# Patient Record
Sex: Female | Born: 1980 | Race: Black or African American | Hispanic: No | Marital: Single | Smoking: Never smoker
Health system: Southern US, Community
[De-identification: ages and names within clinical notes are randomized; demographics above are authoritative.]

## PROBLEM LIST (undated history)

## (undated) ENCOUNTER — Inpatient Hospital Stay (HOSPITAL_COMMUNITY): Payer: Self-pay

## (undated) DIAGNOSIS — K589 Irritable bowel syndrome without diarrhea: Secondary | ICD-10-CM

## (undated) DIAGNOSIS — S8291XA Unspecified fracture of right lower leg, initial encounter for closed fracture: Secondary | ICD-10-CM

## (undated) HISTORY — PX: NO PAST SURGERIES: SHX2092

---

## 1998-05-22 ENCOUNTER — Other Ambulatory Visit: Admission: RE | Admit: 1998-05-22 | Discharge: 1998-05-22 | Payer: Self-pay | Admitting: Obstetrics and Gynecology

## 1998-10-02 ENCOUNTER — Other Ambulatory Visit: Admission: RE | Admit: 1998-10-02 | Discharge: 1998-10-02 | Payer: Self-pay | Admitting: Obstetrics and Gynecology

## 1999-09-15 ENCOUNTER — Inpatient Hospital Stay (HOSPITAL_COMMUNITY): Admission: AD | Admit: 1999-09-15 | Discharge: 1999-09-15 | Payer: Self-pay | Admitting: Obstetrics and Gynecology

## 2000-03-19 ENCOUNTER — Encounter: Payer: Self-pay | Admitting: Emergency Medicine

## 2000-03-19 ENCOUNTER — Emergency Department (HOSPITAL_COMMUNITY): Admission: EM | Admit: 2000-03-19 | Discharge: 2000-03-19 | Payer: Self-pay | Admitting: Emergency Medicine

## 2003-04-14 ENCOUNTER — Other Ambulatory Visit: Admission: RE | Admit: 2003-04-14 | Discharge: 2003-04-14 | Payer: Self-pay | Admitting: Obstetrics and Gynecology

## 2005-03-08 ENCOUNTER — Other Ambulatory Visit: Admission: RE | Admit: 2005-03-08 | Discharge: 2005-03-08 | Payer: Self-pay | Admitting: Obstetrics and Gynecology

## 2006-06-24 HISTORY — PX: GYNECOLOGIC CRYOSURGERY: SHX857

## 2008-09-06 ENCOUNTER — Ambulatory Visit (HOSPITAL_BASED_OUTPATIENT_CLINIC_OR_DEPARTMENT_OTHER): Admission: RE | Admit: 2008-09-06 | Discharge: 2008-09-06 | Payer: Self-pay | Admitting: Family Medicine

## 2008-09-06 ENCOUNTER — Ambulatory Visit: Payer: Self-pay | Admitting: Diagnostic Radiology

## 2012-09-08 ENCOUNTER — Encounter (HOSPITAL_COMMUNITY): Payer: Self-pay | Admitting: Pharmacist

## 2012-09-11 ENCOUNTER — Encounter (HOSPITAL_COMMUNITY): Payer: Self-pay | Admitting: Anesthesiology

## 2012-09-11 ENCOUNTER — Encounter (HOSPITAL_COMMUNITY): Payer: Self-pay | Admitting: *Deleted

## 2012-09-11 ENCOUNTER — Inpatient Hospital Stay (HOSPITAL_COMMUNITY)
Admission: AD | Admit: 2012-09-11 | Discharge: 2012-09-13 | DRG: 371 | Disposition: A | Discharge status: Home / Self Care | Payer: BC Managed Care – PPO | Source: Ambulatory Visit | Attending: Obstetrics and Gynecology | Admitting: Obstetrics and Gynecology

## 2012-09-11 ENCOUNTER — Inpatient Hospital Stay (HOSPITAL_COMMUNITY): Payer: BC Managed Care – PPO | Admitting: Anesthesiology

## 2012-09-11 ENCOUNTER — Encounter (HOSPITAL_COMMUNITY)
Admission: AD | Disposition: A | Discharge status: Home / Self Care | Payer: Self-pay | Source: Ambulatory Visit | Attending: Obstetrics and Gynecology

## 2012-09-11 DIAGNOSIS — O321XX0 Maternal care for breech presentation, not applicable or unspecified: Principal | ICD-10-CM | POA: Diagnosis present

## 2012-09-11 LAB — CBC
HCT: 36.4 % (ref 36.0–46.0)
Hemoglobin: 12.4 g/dL (ref 12.0–15.0)
MCH: 29.8 pg (ref 26.0–34.0)
MCHC: 34.1 g/dL (ref 30.0–36.0)

## 2012-09-11 SURGERY — Surgical Case
Anesthesia: Spinal | Site: Abdomen | Wound class: Clean Contaminated

## 2012-09-11 MED ORDER — DIPHENHYDRAMINE HCL 25 MG PO CAPS: 25.0000 mg | ORAL_CAPSULE | Freq: Four times a day (QID) | ORAL | Status: DC | PRN

## 2012-09-11 MED ORDER — OXYCODONE-ACETAMINOPHEN 5-325 MG PO TABS
1.0000 | ORAL_TABLET | ORAL | Status: DC | PRN
  Administered 2012-09-12 (×2): 1 via ORAL
  Administered 2012-09-12 – 2012-09-13 (×4): 2 via ORAL
  Filled 2012-09-11 (×4): qty 2
  Filled 2012-09-11 (×2): qty 1

## 2012-09-11 MED ORDER — ONDANSETRON HCL 4 MG/2ML IJ SOLN
INTRAMUSCULAR | Status: AC
  Filled 2012-09-11: qty 2

## 2012-09-11 MED ORDER — GLYCOPYRROLATE 0.2 MG/ML IJ SOLN
INTRAMUSCULAR | Status: DC | PRN
  Administered 2012-09-11: 0.2 mg via INTRAVENOUS

## 2012-09-11 MED ORDER — TETANUS-DIPHTH-ACELL PERTUSSIS 5-2.5-18.5 LF-MCG/0.5 IM SUSP: 0.5000 mL | Freq: Once | INTRAMUSCULAR | Status: DC

## 2012-09-11 MED ORDER — OXYTOCIN 10 UNIT/ML IJ SOLN
INTRAMUSCULAR | Status: AC
  Filled 2012-09-11: qty 4

## 2012-09-11 MED ORDER — MEPERIDINE HCL 25 MG/ML IJ SOLN: 6.2500 mg | INTRAMUSCULAR | Status: DC | PRN

## 2012-09-11 MED ORDER — MORPHINE SULFATE 0.5 MG/ML IJ SOLN
INTRAMUSCULAR | Status: AC
  Filled 2012-09-11: qty 10

## 2012-09-11 MED ORDER — DIPHENHYDRAMINE HCL 50 MG/ML IJ SOLN: 25.0000 mg | INTRAMUSCULAR | Status: DC | PRN

## 2012-09-11 MED ORDER — ZOLPIDEM TARTRATE 5 MG PO TABS: 5.0000 mg | ORAL_TABLET | Freq: Every evening | ORAL | Status: DC | PRN

## 2012-09-11 MED ORDER — SENNOSIDES-DOCUSATE SODIUM 8.6-50 MG PO TABS
2.0000 | ORAL_TABLET | Freq: Every day | ORAL | Status: DC
  Administered 2012-09-11 – 2012-09-12 (×2): 2 via ORAL

## 2012-09-11 MED ORDER — CITRIC ACID-SODIUM CITRATE 334-500 MG/5ML PO SOLN
ORAL | Status: AC
  Filled 2012-09-11: qty 15

## 2012-09-11 MED ORDER — KETOROLAC TROMETHAMINE 60 MG/2ML IM SOLN: 60.0000 mg | Freq: Once | INTRAMUSCULAR | Status: AC | PRN

## 2012-09-11 MED ORDER — LACTATED RINGERS IV SOLN
INTRAVENOUS | Status: DC | PRN
  Administered 2012-09-11: 08:00:00 via INTRAVENOUS

## 2012-09-11 MED ORDER — KETOROLAC TROMETHAMINE 30 MG/ML IJ SOLN: 30.0000 mg | Freq: Four times a day (QID) | INTRAMUSCULAR | Status: AC | PRN

## 2012-09-11 MED ORDER — CEFAZOLIN SODIUM-DEXTROSE 2-3 GM-% IV SOLR
2.0000 g | INTRAVENOUS | Status: AC
  Administered 2012-09-11: 2 g via INTRAVENOUS

## 2012-09-11 MED ORDER — SCOPOLAMINE 1 MG/3DAYS TD PT72: 1.0000 | MEDICATED_PATCH | Freq: Once | TRANSDERMAL | Status: DC

## 2012-09-11 MED ORDER — OXYTOCIN 40 UNITS IN LACTATED RINGERS INFUSION - SIMPLE MED: 62.5000 mL/h | INTRAVENOUS | Status: AC

## 2012-09-11 MED ORDER — SIMETHICONE 80 MG PO CHEW: 80.0000 mg | CHEWABLE_TABLET | ORAL | Status: DC | PRN

## 2012-09-11 MED ORDER — EPHEDRINE SULFATE 50 MG/ML IJ SOLN
INTRAMUSCULAR | Status: DC | PRN
  Administered 2012-09-11 (×4): 10 mg via INTRAVENOUS

## 2012-09-11 MED ORDER — FAMOTIDINE IN NACL 20-0.9 MG/50ML-% IV SOLN
INTRAVENOUS | Status: AC
  Filled 2012-09-11: qty 50

## 2012-09-11 MED ORDER — LANOLIN HYDROUS EX OINT: 1.0000 "application " | TOPICAL_OINTMENT | CUTANEOUS | Status: DC | PRN

## 2012-09-11 MED ORDER — METOCLOPRAMIDE HCL 5 MG/ML IJ SOLN: 10.0000 mg | Freq: Three times a day (TID) | INTRAMUSCULAR | Status: DC | PRN

## 2012-09-11 MED ORDER — SODIUM CHLORIDE 0.9 % IJ SOLN: 3.0000 mL | INTRAMUSCULAR | Status: DC | PRN

## 2012-09-11 MED ORDER — HYDROMORPHONE HCL PF 1 MG/ML IJ SOLN: 0.2500 mg | INTRAMUSCULAR | Status: DC | PRN

## 2012-09-11 MED ORDER — ONDANSETRON HCL 4 MG PO TABS: 4.0000 mg | ORAL_TABLET | ORAL | Status: DC | PRN

## 2012-09-11 MED ORDER — LACTATED RINGERS IV SOLN
INTRAVENOUS | Status: DC
  Administered 2012-09-11: 18:00:00 via INTRAVENOUS

## 2012-09-11 MED ORDER — MAGNESIUM HYDROXIDE 400 MG/5ML PO SUSP: 30.0000 mL | ORAL | Status: DC | PRN

## 2012-09-11 MED ORDER — IBUPROFEN 600 MG PO TABS
600.0000 mg | ORAL_TABLET | Freq: Four times a day (QID) | ORAL | Status: DC | PRN
  Filled 2012-09-11 (×5): qty 1

## 2012-09-11 MED ORDER — NALOXONE HCL 1 MG/ML IJ SOLN
1.0000 ug/kg/h | INTRAVENOUS | Status: DC | PRN
  Filled 2012-09-11: qty 2

## 2012-09-11 MED ORDER — NALBUPHINE HCL 10 MG/ML IJ SOLN
5.0000 mg | INTRAMUSCULAR | Status: DC | PRN
  Filled 2012-09-11: qty 1

## 2012-09-11 MED ORDER — FENTANYL CITRATE 0.05 MG/ML IJ SOLN
INTRAMUSCULAR | Status: DC | PRN
  Administered 2012-09-11: 25 ug via INTRATHECAL

## 2012-09-11 MED ORDER — ONDANSETRON HCL 4 MG/2ML IJ SOLN: 4.0000 mg | Freq: Three times a day (TID) | INTRAMUSCULAR | Status: DC | PRN

## 2012-09-11 MED ORDER — PRENATAL MULTIVITAMIN CH
1.0000 | ORAL_TABLET | Freq: Every day | ORAL | Status: DC
  Administered 2012-09-12 – 2012-09-13 (×2): 1 via ORAL
  Filled 2012-09-11 (×2): qty 1

## 2012-09-11 MED ORDER — CEFAZOLIN SODIUM-DEXTROSE 2-3 GM-% IV SOLR
INTRAVENOUS | Status: AC
  Filled 2012-09-11: qty 50

## 2012-09-11 MED ORDER — NALOXONE HCL 0.4 MG/ML IJ SOLN: 0.4000 mg | INTRAMUSCULAR | Status: DC | PRN

## 2012-09-11 MED ORDER — WITCH HAZEL-GLYCERIN EX PADS: 1.0000 "application " | MEDICATED_PAD | CUTANEOUS | Status: DC | PRN

## 2012-09-11 MED ORDER — PHENYLEPHRINE 40 MCG/ML (10ML) SYRINGE FOR IV PUSH (FOR BLOOD PRESSURE SUPPORT)
PREFILLED_SYRINGE | INTRAVENOUS | Status: AC
  Filled 2012-09-11: qty 5

## 2012-09-11 MED ORDER — OXYTOCIN 10 UNIT/ML IJ SOLN
40.0000 [IU] | INTRAVENOUS | Status: DC | PRN
  Administered 2012-09-11: 40 [IU] via INTRAVENOUS

## 2012-09-11 MED ORDER — EPHEDRINE 5 MG/ML INJ
INTRAVENOUS | Status: AC
  Filled 2012-09-11: qty 10

## 2012-09-11 MED ORDER — ONDANSETRON HCL 4 MG/2ML IJ SOLN: 4.0000 mg | INTRAMUSCULAR | Status: DC | PRN

## 2012-09-11 MED ORDER — SCOPOLAMINE 1 MG/3DAYS TD PT72
MEDICATED_PATCH | TRANSDERMAL | Status: AC
  Administered 2012-09-11: 1.5 mg via TRANSDERMAL
  Filled 2012-09-11: qty 1

## 2012-09-11 MED ORDER — MORPHINE SULFATE (PF) 0.5 MG/ML IJ SOLN
INTRAMUSCULAR | Status: DC | PRN
Start: 2012-09-11 — End: 2012-09-11
  Administered 2012-09-11: .15 mg via INTRATHECAL

## 2012-09-11 MED ORDER — FENTANYL CITRATE 0.05 MG/ML IJ SOLN
INTRAMUSCULAR | Status: AC
  Filled 2012-09-11: qty 2

## 2012-09-11 MED ORDER — IBUPROFEN 600 MG PO TABS
600.0000 mg | ORAL_TABLET | Freq: Four times a day (QID) | ORAL | Status: DC
  Administered 2012-09-12 – 2012-09-13 (×7): 600 mg via ORAL
  Filled 2012-09-11 (×3): qty 1

## 2012-09-11 MED ORDER — DIPHENHYDRAMINE HCL 25 MG PO CAPS
25.0000 mg | ORAL_CAPSULE | ORAL | Status: DC | PRN
  Filled 2012-09-11: qty 1

## 2012-09-11 MED ORDER — CITRIC ACID-SODIUM CITRATE 334-500 MG/5ML PO SOLN
ORAL | Status: AC
  Administered 2012-09-11: 30 mL
  Filled 2012-09-11: qty 15

## 2012-09-11 MED ORDER — FAMOTIDINE IN NACL 20-0.9 MG/50ML-% IV SOLN
INTRAVENOUS | Status: AC
  Administered 2012-09-11: 20 mg
  Filled 2012-09-11: qty 50

## 2012-09-11 MED ORDER — SIMETHICONE 80 MG PO CHEW
80.0000 mg | CHEWABLE_TABLET | Freq: Three times a day (TID) | ORAL | Status: DC
  Administered 2012-09-11 – 2012-09-13 (×6): 80 mg via ORAL

## 2012-09-11 MED ORDER — DIBUCAINE 1 % RE OINT: 1.0000 "application " | TOPICAL_OINTMENT | RECTAL | Status: DC | PRN

## 2012-09-11 MED ORDER — DIPHENHYDRAMINE HCL 50 MG/ML IJ SOLN: 12.5000 mg | INTRAMUSCULAR | Status: DC | PRN

## 2012-09-11 MED ORDER — PHENYLEPHRINE HCL 10 MG/ML IJ SOLN
INTRAMUSCULAR | Status: DC | PRN
  Administered 2012-09-11 (×2): 80 ug via INTRAVENOUS
  Administered 2012-09-11: 40 ug via INTRAVENOUS

## 2012-09-11 MED ORDER — ONDANSETRON HCL 4 MG/2ML IJ SOLN
INTRAMUSCULAR | Status: DC | PRN
  Administered 2012-09-11: 4 mg via INTRAVENOUS

## 2012-09-11 MED ORDER — MENTHOL 3 MG MT LOZG: 1.0000 | LOZENGE | OROMUCOSAL | Status: DC | PRN

## 2012-09-11 MED ORDER — KETOROLAC TROMETHAMINE 60 MG/2ML IM SOLN
INTRAMUSCULAR | Status: AC
  Administered 2012-09-11: 60 mg via INTRAMUSCULAR
  Filled 2012-09-11: qty 2

## 2012-09-11 SURGICAL SUPPLY — 43 items
BENZOIN TINCTURE PRP APPL 2/3 (GAUZE/BANDAGES/DRESSINGS) ×2 IMPLANT
CLOTH BEACON ORANGE TIMEOUT ST (SAFETY) ×2 IMPLANT
DRAPE LG THREE QUARTER DISP (DRAPES) ×2 IMPLANT
DRESSING TELFA 8X3 (GAUZE/BANDAGES/DRESSINGS) ×2 IMPLANT
DRSG OPSITE POSTOP 4X10 (GAUZE/BANDAGES/DRESSINGS) ×2 IMPLANT
DURAPREP 26ML APPLICATOR (WOUND CARE) ×2 IMPLANT
ELECT REM PT RETURN 9FT ADLT (ELECTROSURGICAL) ×2
ELECTRODE REM PT RTRN 9FT ADLT (ELECTROSURGICAL) ×1 IMPLANT
EXTRACTOR VACUUM M CUP 4 TUBE (SUCTIONS) IMPLANT
GAUZE SPONGE 4X4 12PLY STRL LF (GAUZE/BANDAGES/DRESSINGS) ×2 IMPLANT
GLOVE BIO SURGEON STRL SZ7 (GLOVE) ×8 IMPLANT
GLOVE BIOGEL PI IND STRL 6.5 (GLOVE) ×1 IMPLANT
GLOVE BIOGEL PI IND STRL 7.0 (GLOVE) ×2 IMPLANT
GLOVE BIOGEL PI INDICATOR 6.5 (GLOVE) ×1
GLOVE BIOGEL PI INDICATOR 7.0 (GLOVE) ×2
GLOVE ECLIPSE 6.0 STRL STRAW (GLOVE) ×4 IMPLANT
GLOVE SURG SS PI 7.0 STRL IVOR (GLOVE) ×4 IMPLANT
GOWN STRL REIN XL XLG (GOWN DISPOSABLE) ×8 IMPLANT
KIT ABG SYR 3ML LUER SLIP (SYRINGE) ×4 IMPLANT
NEEDLE HYPO 25X5/8 SAFETYGLIDE (NEEDLE) ×4 IMPLANT
NS IRRIG 1000ML POUR BTL (IV SOLUTION) ×2 IMPLANT
PACK C SECTION WH (CUSTOM PROCEDURE TRAY) ×2 IMPLANT
PAD OB MATERNITY 4.3X12.25 (PERSONAL CARE ITEMS) ×2 IMPLANT
SLEEVE SCD COMPRESS KNEE MED (MISCELLANEOUS) ×2 IMPLANT
STAPLER VISISTAT 35W (STAPLE) IMPLANT
STRIP CLOSURE SKIN 1/2X4 (GAUZE/BANDAGES/DRESSINGS) ×2 IMPLANT
STRIP CLOSURE SKIN 1/4X4 (GAUZE/BANDAGES/DRESSINGS) IMPLANT
SUT CHROMIC 0 CT 1 (SUTURE) IMPLANT
SUT CHROMIC 0 CTX 36 (SUTURE) ×4 IMPLANT
SUT CHROMIC 2 0 SH (SUTURE) IMPLANT
SUT MNCRL 0 VIOLET CTX 36 (SUTURE) ×4 IMPLANT
SUT MONOCRYL 0 CTX 36 (SUTURE) ×4
SUT PDS AB 0 CT 36 (SUTURE) ×2 IMPLANT
SUT PLAIN 0 NONE (SUTURE) IMPLANT
SUT PLAIN 2 0 (SUTURE) ×1
SUT PLAIN 2 0 XLH (SUTURE) ×2 IMPLANT
SUT PLAIN ABS 2-0 CT1 27XMFL (SUTURE) ×1 IMPLANT
SUT VIC AB 3-0 CT1 27 (SUTURE) ×1
SUT VIC AB 3-0 CT1 TAPERPNT 27 (SUTURE) ×1 IMPLANT
TAPE CLOTH SURG 4X10 WHT LF (GAUZE/BANDAGES/DRESSINGS) ×2 IMPLANT
TOWEL OR 17X24 6PK STRL BLUE (TOWEL DISPOSABLE) ×6 IMPLANT
TRAY FOLEY CATH 14FR (SET/KITS/TRAYS/PACK) ×2 IMPLANT
WATER STERILE IRR 1000ML POUR (IV SOLUTION) ×2 IMPLANT

## 2012-09-11 NOTE — Brief Op Note (Signed)
09/11/2012  9:28 AM  PATIENT:  [name]  32 y.o. female  PRE-OPERATIVE DIAGNOSIS:  Breech  POST-OPERATIVE DIAGNOSIS:  Breech  PROCEDURE:  Low Transverse Cesarean Section  SURGEON:  Surgeon(s) and Role:    * Harold Hedge, MD - Primary  PHYSICIAN ASSISTANT:   ASSISTANTS: none   ANESTHESIA:   spinal  EBL:  Total I/O In: 1500 [I.V.:1500] Out: 700 [Urine:100; Blood:600]  BLOOD ADMINISTERED:none  DRAINS: Urinary Catheter (Foley)   LOCAL MEDICATIONS USED:  NONE  SPECIMEN:  No Specimen  DISPOSITION OF SPECIMEN:  N/A  COUNTS:  YES  TOURNIQUET:  * No tourniquets in log *  DICTATION: .Other Dictation: Dictation Number [id number]  PLAN OF CARE: Admit to inpatient   PATIENT DISPOSITION:  PACU - hemodynamically stable.   Delay start of Pharmacological VTE agent (>24hrs) due to surgical blood loss or risk of bleeding: not applicable

## 2012-09-11 NOTE — MAU Note (Signed)
Contractions, scheduled C/S for breech presentation

## 2012-09-11 NOTE — Anesthesia Preprocedure Evaluation (Addendum)
Anesthesia Evaluation  Patient identified by MRN, date of birth, ID band Patient awake    Reviewed: Allergy & Precautions, H&P , Patient's Chart, lab work & pertinent test results  History of Anesthesia Complications (+) PONV  Airway Mallampati: II TM Distance: >3 FB Neck ROM: full    Dental no notable dental hx.    Pulmonary  breath sounds clear to auscultation  Pulmonary exam normal       Cardiovascular Exercise Tolerance: Good Rhythm:regular Rate:Normal     Neuro/Psych    GI/Hepatic   Endo/Other    Renal/GU      Musculoskeletal   Abdominal   Peds  Hematology   Anesthesia Other Findings   Reproductive/Obstetrics                           Anesthesia Physical Anesthesia Plan  ASA: II  Anesthesia Plan: Spinal   Post-op Pain Management:    Induction:   Airway Management Planned:   Additional Equipment:   Intra-op Plan:   Post-operative Plan:   Informed Consent: I have reviewed the patients History and Physical, chart, labs and discussed the procedure including the risks, benefits and alternatives for the proposed anesthesia with the patient or authorized representative who has indicated his/her understanding and acceptance.   Dental Advisory Given  Plan Discussed with: CRNA  Anesthesia Plan Comments: (Lab work confirmed with CRNA in room. Platelets okay. Discussed spinal anesthetic, and patient consents to the procedure:  included risk of possible headache,backache, failed block, allergic reaction, and nerve injury. This patient was asked if she had any questions or concerns before the procedure started. )        Anesthesia Quick Evaluation  

## 2012-09-11 NOTE — Transfer of Care (Signed)
Immediate Anesthesia Transfer of Care Note  Patient: [name]  Procedure(s) Performed: Procedure(s): CESAREAN SECTION (N/A)  Patient Location: PACU  Anesthesia Type:Spinal  Level of Consciousness: awake, alert  and oriented  Airway & Oxygen Therapy: Patient Spontanous Breathing  Post-op Assessment: Report given to PACU RN and Post -op Vital signs reviewed and stable  Post vital signs: Reviewed and stable  Complications: No apparent anesthesia complications

## 2012-09-11 NOTE — H&P (Signed)
[name] is a 32 y.o. female presenting at 39.6 with spontaneous onset of labor.  Sonogram confirms breech presentation.  For cesarean section. Maternal Medical History:  Reason for admission: Contractions.   Contractions: Onset was 1-2 hours ago.   Frequency: regular.   Perceived severity is moderate.    Fetal activity: Perceived fetal activity is normal.    Prenatal complications: no prenatal complications Prenatal Complications - Diabetes: none.    OB History   Grav Para Term Preterm Abortions TAB SAB Ect Mult Living   2 1 1       1      Past Medical History  Diagnosis Date  . Medical history non-contributory    Past Surgical History  Procedure Laterality Date  . No past surgeries     Family History: family history is not on file. Social History:  reports that she has never smoked. She does not have any smokeless tobacco history on file. She reports that she does not drink alcohol or use illicit drugs.   Prenatal Transfer Tool  Maternal Diabetes: No Genetic Screening: Normal Maternal Ultrasounds/Referrals: Normal Fetal Ultrasounds or other Referrals:  None Maternal Substance Abuse:  No Significant Maternal Medications:  None Significant Maternal Lab Results:  None Other Comments:  None  ROS  Dilation: 3 Effacement (%): 50 Station: -2 Exam by:: Nalu Troublefield,MD Blood pressure 118/77, pulse 88, temperature 99.1 F (37.3 C), temperature source Oral, resp. rate 18, height 5\' 5"  (1.651 m), weight 87.726 kg (193 lb 6.4 oz). Maternal Exam:  Uterine Assessment: Contraction strength is moderate.  Contraction frequency is regular.   Abdomen: Patient reports no abdominal tenderness. Fundal height is c/w dates.   Estimated fetal weight is 8.   Fetal presentation: breech  Cervix: Cervix evaluated by digital exam.   Cervix 3 cm 50 % breech   Physical Exam  Cardiovascular: Normal rate, regular rhythm and normal heart sounds.   Respiratory: Effort normal and breath sounds  normal.  GI:  Gravid uterus  Musculoskeletal: She exhibits edema.  Neurological: She has normal reflexes.    Prenatal labs: ABO, Rh:   Antibody:   Rubella:   RPR:    HBsAg:    HIV:    GBS:     Assessment/Plan: Intrauterine pregnancy at term with spontaneous onset of labor Breech presentation Plan Cesarean section.Risk of cesarean section discussed.  These include:  Risk of infection;  Risk of hemorrhage that could require transfusions with the associated risk of aids or hepatitis;  Excessive bleeding could require hysterectomy;  Risk of injury to adjacent organs including bladder, bowel or ureters;  Risk of DVT's and possible pulmonary embolus.  Patient expresses a understanding of indications and risks.;  Monice Lundy S 09/11/2012, 7:17 AM

## 2012-09-11 NOTE — Anesthesia Postprocedure Evaluation (Signed)
  Anesthesia Post-op Note  Patient: [name]  Procedure(s) Performed: Procedure(s): CESAREAN SECTION (N/A)   Patient is awake, responsive, moving her legs, and has signs of resolution of her numbness. Pain and nausea are reasonably well controlled. Vital signs are stable and clinically acceptable. Oxygen saturation is clinically acceptable. There are no apparent anesthetic complications at this time. Patient is ready for discharge.

## 2012-09-11 NOTE — Consult Note (Signed)
Neonatology Note:  Attendance at C-section:  I was asked by Dr. Tomblin to attend this primary C/S at term due to breech presentation in labor. The mother is a G4P1A2 O pos, GBS neg with an uncomplicated pregnancy. ROM at delivery, fluid clear. Infant vigorous with good spontaneous cry and tone. Needed only minimal bulb suctioning. Ap 8/9. Lungs clear to ausc in DR. To CN to care of Pediatrician.  [place] C. Konnie Noffsinger, MD  

## 2012-09-11 NOTE — Progress Notes (Signed)
Reviewed with patient procedure and risks. All questions answered.

## 2012-09-11 NOTE — Anesthesia Procedure Notes (Signed)

## 2012-09-12 LAB — CBC
HCT: 29.7 % — ABNORMAL LOW (ref 36.0–46.0)
Hemoglobin: 9.9 g/dL — ABNORMAL LOW (ref 12.0–15.0)
MCHC: 33.3 g/dL (ref 30.0–36.0)
RBC: 3.33 MIL/uL — ABNORMAL LOW (ref 3.87–5.11)

## 2012-09-12 NOTE — Op Note (Unsigned)
[name], [name] NO.:  [id number]  MEDICAL RECORD NO.:  [id number]  LOCATION:  9126                          FACILITY:  [facility]  PHYSICIAN:  Guy Sandifer. Henderson Cloud, M.D. DATE OF BIRTH:  25-Apr-1981  DATE OF PROCEDURE:  09/11/2012 DATE OF DISCHARGE:                              OPERATIVE REPORT   PREOPERATIVE DIAGNOSES: 1. Intrauterine pregnancy at 38-6/7 weeks' estimated gestational age. 2. Breech. 3. Labor.  POSTOPERATIVE DIAGNOSES: 1. Intrauterine pregnancy at 38-6/7 weeks' estimated gestational age. 2. Breech. 3. Labor.  PROCEDURE:  Primary low transverse cesarean section.  SURGEON:  Guy Sandifer. Henderson Cloud, M.D.  ANESTHESIA:  Spinal.  ESTIMATED BLOOD LOSS:  750 mL.  FINDINGS:  Viable female infant, Apgars of 8 and nine at 1 and five minutes respectively.  INDICATIONS AND CONSENT:  This patient is a 32 year old married black female, G2, P2, at 12 and 6-7th weeks.  Pregnancy has been uncomplicated except for breech presentation.  She presents to the hospital in labor. Cervix was 3 cm dilated with regular contractions.  Cesarean section was discussed with the patient.  Potential risks and complications were discussed preoperatively including, but not limited to, infection, organ damage, bleeding requiring transfusion of blood products with HIV and hepatitis acquisition, DVT, PE, pneumonia.  All questions were answered and consent is signed on the chart.  PROCEDURE:  The patient was taken to the operating room, where she was identified.  Spinal anesthetic was placed and she was placed in a dorsal supine position with a 15 degree left lateral wedge.  She was then prepped.  Foley catheter was placed and the bladder was drained.  She was draped for [facility] protocol.  Time-out was undertaken. After testing for adequate spinal anesthesia, skin was entered through a Pfannenstiel incision and dissection was carried out in layers to the peritoneum.  The  peritoneum was incised and extended superiorly and inferiorly.  The vesicouterine peritoneum was taken down cephalolaterally.  The uterus was incised in a low transverse manner and the uterine cavity was entered bluntly with a hemostat.  The uterine incision was then extended cephalolaterally with fingers.  Artificial rupture of membranes for clear fluid was carried out.  Baby was then delivered from the frank breech position without difficulty.  Good cry and tone was noted.  Cord was clamped and cut.  The baby was handed to awaiting pediatrics team.  Placenta was manually delivered intact. Uterine cavity was clean.  Uterus was closed in 2 running locking imbricating layers of 0 Monocryl suture which achieves good hemostasis. Anterior peritoneum was closed in a running fashion with 0 Monocryl suture which was also used to reapproximate the pyramidalis muscle in the midline.  The anterior rectus fascia was closed in running fashion with a 0 looped PDS suture.  The subcutaneous tissues were reapproximated with interrupted plain suture and the skin was closed with a 3-0 Vicryl on a Keith needle.  Steri-Strips were applied. Dressings were applied and all counts were correct.  The patient was transferred to the recovery room in stable condition.     Guy Sandifer Henderson Cloud, M.D.     JET/MEDQ  D:  09/11/2012  T:  09/11/2012  Job:  [id number]

## 2012-09-12 NOTE — Lactation Note (Signed)
Patient requested to give baby formula during the night.  The benefits of exclusive breastfeeding, risks of supplementation, and benefits of breastfeeding before supplementing were discussed.  Baby took 7ml of formula.  Mom voiced that she wanted the Father of the baby to be able to feed the baby as well.

## 2012-09-12 NOTE — Progress Notes (Signed)
Subjective: Postpartum Day 1: Cesarean Delivery Patient reports tolerating PO and no problems voiding.    Objective: Vital signs in last 24 hours: Temp:  [97.4 F (36.3 C)-99.4 F (37.4 C)] 98.1 F (36.7 C) (03/22 0914) Pulse Rate:  [68-95] 76 (03/22 0914) Resp:  [16-20] 16 (03/22 0914) BP: (99-130)/(51-76) 111/67 mmHg (03/22 0914) SpO2:  [94 %-100 %] 99 % (03/22 0914)  Physical Exam:  General: alert, cooperative and no distress Lochia: appropriate Uterine Fundus: firm Incision: healing well DVT Evaluation: No evidence of DVT seen on physical exam.   Recent Labs  09/11/12 0726 09/12/12 0600  HGB 12.4 9.9*  HCT 36.4 29.7*    Assessment/Plan: Status post Cesarean section. Doing well postoperatively.  Continue current care.  Eryn Marandola II,Day Deery E 09/12/2012, 9:42 AM

## 2012-09-13 MED ORDER — OXYCODONE-ACETAMINOPHEN 5-325 MG PO TABS: 1.0000 | ORAL_TABLET | Freq: Four times a day (QID) | ORAL | Status: DC | PRN

## 2012-09-13 MED ORDER — IBUPROFEN 600 MG PO TABS: 600.0000 mg | ORAL_TABLET | Freq: Four times a day (QID) | ORAL | Status: DC | PRN

## 2012-09-13 NOTE — Discharge Summary (Signed)
Obstetric Discharge Summary Reason for Admission: onset of labor Prenatal Procedures: ultrasound Intrapartum Procedures: cesarean: low cervical, transverse Postpartum Procedures: none Complications-Operative and Postpartum: none Hemoglobin  Date Value Range Status  09/12/2012 9.9* 12.0 - 15.0 g/dL Final     DELTA CHECK NOTED     REPEATED TO VERIFY     HCT  Date Value Range Status  09/12/2012 29.7* 36.0 - 46.0 % Final    Physical Exam:  General: alert, cooperative and no distress Lochia: appropriate Uterine Fundus: firm Incision: healing well DVT Evaluation: No evidence of DVT seen on physical exam.  Discharge Diagnoses: Term Pregnancy-delivered  Discharge Information: Date: 09/13/2012 Activity: pelvic rest Diet: routine Medications: PNV, Ibuprofen and Percocet Condition: stable Instructions: refer to practice specific booklet Discharge to: home Follow-up Information   Call in 2 weeks to follow up.      Newborn Data: Live born female  Birth Weight: 6 lb 8.9 oz (2974 g) APGAR: 8, 9  Home with mother.  [name] II,[name] 09/13/2012, 7:05 AM

## 2012-09-13 NOTE — Progress Notes (Signed)
Subjective: Postpartum Day 2: Cesarean Delivery Patient reports tolerating PO, + flatus and no problems voiding.    Objective: Vital signs in last 24 hours: Temp:  [97.9 F (36.6 C)-98.2 F (36.8 C)] 97.9 F (36.6 C) (03/23 1610) Pulse Rate:  [76-80] 80 (03/23 0613) Resp:  [16-18] 18 (03/23 0613) BP: (111-122)/(67-77) 122/75 mmHg (03/23 0613) SpO2:  [99 %] 99 % (03/22 0914)  Physical Exam:  General: alert, cooperative and no distress Lochia: appropriate Uterine Fundus: firm Incision: healing well DVT Evaluation: No evidence of DVT seen on physical exam.   Recent Labs  09/11/12 0726 09/12/12 0600  HGB 12.4 9.9*  HCT 36.4 29.7*    Assessment/Plan: Status post Cesarean section. Doing well postoperatively.  Discharge home with standard precautions and return to clinic in 4-6 weeks.  Kenniyah Sasaki II,Adonna Horsley E 09/13/2012, 7:03 AM

## 2012-09-14 ENCOUNTER — Encounter (HOSPITAL_COMMUNITY): Payer: Self-pay | Admitting: Obstetrics and Gynecology

## 2012-09-15 ENCOUNTER — Inpatient Hospital Stay (HOSPITAL_COMMUNITY): Admission: RE | Admit: 2012-09-15 | Payer: BC Managed Care – PPO | Source: Ambulatory Visit

## 2012-09-16 ENCOUNTER — Inpatient Hospital Stay (HOSPITAL_COMMUNITY)
Admission: AD | Admit: 2012-09-16 | Payer: BC Managed Care – PPO | Source: Ambulatory Visit | Admitting: Obstetrics and Gynecology

## 2012-09-16 SURGERY — Surgical Case
Anesthesia: Regional

## 2012-11-16 ENCOUNTER — Emergency Department (HOSPITAL_BASED_OUTPATIENT_CLINIC_OR_DEPARTMENT_OTHER): Payer: BC Managed Care – PPO

## 2012-11-16 ENCOUNTER — Emergency Department (HOSPITAL_BASED_OUTPATIENT_CLINIC_OR_DEPARTMENT_OTHER)
Admission: EM | Admit: 2012-11-16 | Discharge: 2012-11-17 | Disposition: A | Discharge status: Home / Self Care | Payer: BC Managed Care – PPO | Attending: Emergency Medicine | Admitting: Emergency Medicine

## 2012-11-16 ENCOUNTER — Encounter (HOSPITAL_BASED_OUTPATIENT_CLINIC_OR_DEPARTMENT_OTHER): Payer: Self-pay | Admitting: *Deleted

## 2012-11-16 DIAGNOSIS — S93326A Dislocation of tarsometatarsal joint of unspecified foot, initial encounter: Secondary | ICD-10-CM | POA: Insufficient documentation

## 2012-11-16 DIAGNOSIS — S82891A Other fracture of right lower leg, initial encounter for closed fracture: Secondary | ICD-10-CM

## 2012-11-16 DIAGNOSIS — S93324A Dislocation of tarsometatarsal joint of right foot, initial encounter: Secondary | ICD-10-CM

## 2012-11-16 DIAGNOSIS — Z79899 Other long term (current) drug therapy: Secondary | ICD-10-CM | POA: Insufficient documentation

## 2012-11-16 DIAGNOSIS — Y9289 Other specified places as the place of occurrence of the external cause: Secondary | ICD-10-CM | POA: Insufficient documentation

## 2012-11-16 DIAGNOSIS — S82843A Displaced bimalleolar fracture of unspecified lower leg, initial encounter for closed fracture: Secondary | ICD-10-CM | POA: Insufficient documentation

## 2012-11-16 DIAGNOSIS — W108XXA Fall (on) (from) other stairs and steps, initial encounter: Secondary | ICD-10-CM | POA: Insufficient documentation

## 2012-11-16 DIAGNOSIS — Y9389 Activity, other specified: Secondary | ICD-10-CM | POA: Insufficient documentation

## 2012-11-16 MED ORDER — PROPOFOL 10 MG/ML IV BOLUS
INTRAVENOUS | Status: AC | PRN
  Administered 2012-11-16: 40 mg via INTRAVENOUS

## 2012-11-16 MED ORDER — HYDROMORPHONE HCL PF 1 MG/ML IJ SOLN
1.0000 mg | Freq: Once | INTRAMUSCULAR | Status: AC
  Administered 2012-11-16: 1 mg via INTRAVENOUS
  Filled 2012-11-16: qty 1

## 2012-11-16 MED ORDER — FENTANYL CITRATE 0.05 MG/ML IJ SOLN
100.0000 ug | Freq: Once | INTRAMUSCULAR | Status: AC
  Administered 2012-11-16: 100 ug via INTRAVENOUS
  Filled 2012-11-16: qty 2

## 2012-11-16 MED ORDER — FENTANYL CITRATE 0.05 MG/ML IJ SOLN
INTRAMUSCULAR | Status: AC
  Filled 2012-11-16: qty 2

## 2012-11-16 MED ORDER — FENTANYL CITRATE 0.05 MG/ML IJ SOLN
50.0000 ug | Freq: Once | INTRAMUSCULAR | Status: AC
  Administered 2012-11-16: 50 ug via NASAL

## 2012-11-16 MED ORDER — PROPOFOL 10 MG/ML IV BOLUS
0.5000 mg/kg | Freq: Once | INTRAVENOUS | Status: AC
  Administered 2012-11-16: 40.2 mg via INTRAVENOUS
  Filled 2012-11-16: qty 20

## 2012-11-16 NOTE — ED Provider Notes (Signed)
History    This chart was scribed for Jenny Berko B. Bernette Mayers, MD by [name], ED Scribe. This patient was seen in room MH03/MH03 and the patient's care was started 9:43 PM.   CSN: [id number]  Arrival date & time 11/16/12  2136   First MD Initiated Contact with Patient 11/16/12 2140      Chief Complaint  Patient presents with  . Ankle Injury    The history is provided by the patient. No language interpreter was used.    HPI Comments: [name] is a 32 y.o. female who presents to the Emergency Department complaining of a new R ankle injury that occurred about 30 minutes ago. Pt states she fell down 3 steps and now she has pain and swelling to her R ankle. She denies taking any regular daily medications other than aspirin/ibuprofen for pain from recent C-section. She is not breast-feeding at this time. She denies any pertinent past medical history. Pt denies smoking and alcohol use.   Past Medical History  Diagnosis Date  . Medical history non-contributory     Past Surgical History  Procedure Laterality Date  . No past surgeries    . Cesarean section N/A 09/11/2012    Procedure: CESAREAN SECTION;  Surgeon: Leslie Andrea, MD;  Location: [facility] ORS;  Service: Obstetrics;  Laterality: N/A;    No family history on file.  History  Substance Use Topics  . Smoking status: Never Smoker   . Smokeless tobacco: Not on file  . Alcohol Use: No    OB History   Grav Para Term Preterm Abortions TAB SAB Ect Mult Living   2 2 2       2       Review of Systems A complete 10 system review of systems was obtained and all systems are negative except as noted in the HPI and PMH.   Allergies  Review of patient's allergies indicates no known allergies.  Home Medications   Current Outpatient Rx  Name  Route  Sig  Dispense  Refill  . acetaminophen (TYLENOL) 325 MG tablet   Oral   Take 650 mg by mouth every 6 (six) hours as needed for pain.         Marland Kitchen ibuprofen (ADVIL,MOTRIN) 600 MG  tablet   Oral   Take 1 tablet (600 mg total) by mouth every 6 (six) hours as needed.   30 tablet   0   . oxyCODONE-acetaminophen (PERCOCET/ROXICET) 5-325 MG per tablet   Oral   Take 1-2 tablets by mouth every 6 (six) hours as needed.   30 tablet   0   . Prenatal Vit-Fe Fumarate-FA (PRENATAL MULTIVITAMIN) TABS   Oral   Take 1 tablet by mouth daily at 12 noon.           BP 119/65  Pulse 64  Temp(Src) 98.5 F (36.9 C) (Oral)  Resp 18  Wt 177 lb (80.287 kg)  BMI 29.45 kg/m2  SpO2 99%  Physical Exam  Nursing note and vitals reviewed. Constitutional: She is oriented to person, place, and time. She appears well-developed and well-nourished.  HENT:  Head: Normocephalic and atraumatic.  Eyes: EOM are normal. Pupils are equal, round, and reactive to light.  Neck: Normal range of motion. Neck supple.  Cardiovascular: Normal rate, normal heart sounds and intact distal pulses.   Pulses normal.   Pulmonary/Chest: Effort normal and breath sounds normal.  Abdominal: Bowel sounds are normal. She exhibits no distension. There is no tenderness.  Musculoskeletal: Normal range of motion. She exhibits no edema and no tenderness.  R ankle is swollen with obvious deformity. Unable ROM due to pain.   Tender over proximal fibula.  Neurological: She is alert and oriented to person, place, and time. She has normal strength. No cranial nerve deficit or sensory deficit.  Skin: Skin is warm and dry. No rash noted.  Psychiatric: She has a normal mood and affect.    ED Course  ORTHOPEDIC INJURY TREATMENT Date/Time: 11/16/2012 11:57 PM Performed by: Susy Frizzle B. Authorized by: Pollyann Savoy Consent: Verbal consent obtained. written consent obtained. Risks and benefits: risks, benefits and alternatives were discussed Consent given by: patient Patient identity confirmed: verbally with patient and arm band Time out: Immediately prior to procedure a "time out" was called to verify the  correct patient, procedure, equipment, support staff and site/side marked as required. Injury location: ankle Location details: right ankle Injury type: fracture-dislocation Fracture type: bimalleolar Pre-procedure neurovascular assessment: neurovascularly intact Pre-procedure distal perfusion: normal Pre-procedure neurological function: normal Pre-procedure range of motion: reduced Local anesthesia used: no Patient sedated: yes Sedation type: moderate (conscious) sedation Sedatives: propofol Analgesia: fentanyl Vitals: Vital signs were monitored during sedation. Manipulation performed: yes Reduction successful: yes Immobilization: splint Splint type: posterior and stirrup. Supplies used: Ortho-Glass Post-procedure neurovascular assessment: post-procedure neurovascularly intact Post-procedure distal perfusion: normal Post-procedure neurological function: normal Post-procedure range of motion: unchanged Patient tolerance: Patient tolerated the procedure well with no immediate complications.   (including critical care time)   DIAGNOSTIC STUDIES: Oxygen Saturation is 99% on RA, normal by my interpretation.    COORDINATION OF CARE: 9:50 PM Discussed treatment plan with pt at bedside and pt agreed to plan.   Labs Reviewed - No data to display Dg Tibia/fibula Right  11/16/2012   *RADIOLOGY REPORT*  Clinical Data: Right lateral ankle and lower leg pain after falling down stairs.  RIGHT TIBIA AND FIBULA - 2 VIEW  Comparison: None.  Findings: There is question of a tiny fracture through the fibular head; this is difficult to characterize, and may simply be artifactual.  The lateral and posterior malleolar fractures are better characterized on concurrent ankle radiographs; there is associated significant displacement and angulation of the talar dome.  Soft tissue swelling is noted about the ankle.  Known metatarsal fractures are better characterized on ankle radiographs. An os trigonum is  noted.  The knee joint is grossly unremarkable in appearance.  No significant knee joint effusion is identified, though the lateral view is somewhat suboptimal.  IMPRESSION:  1.  Question of tiny fracture through the fibular head; this is difficult to characterize, and may simply be artifactual. 2.  Lateral and posterior malleolar fractures are better characterized on concurrent ankle radiographs; associated significant displacement and angulation of the talar dome. 3.  Known metatarsal fractures are better characterized on concurrent ankle radiographs.   Original Report Authenticated By: Tonia Ghent, M.D.   Dg Ankle Complete Right  11/16/2012   *RADIOLOGY REPORT*  Clinical Data: Status post fall down stairs; right lateral ankle and lower leg pain.  RIGHT ANKLE - COMPLETE 3+ VIEW  Comparison: None.  Findings: There is a significantly displaced oblique fracture extending across the distal fibula, with lateral and posterior displacement and angulation.  There is suspicion of an underlying posterior malleolar fracture, demonstrating minimal displacement. Due to the displaced lateral malleolar fracture, there is displacement and subluxation of the talar dome, with lateral and posterior angulation.  The interosseous space appears grossly intact.  No definite  medial malleolar fracture is seen.  Diffuse soft tissue swelling is noted about the ankle, most prominent anteriorly and laterally.  There also appears to be fractures through the proximal second, third and fourth metatarsals, with question of a fracture through the proximal fifth metatarsal.  These are difficult to fully assess on ankle radiographs.  There is lateral displacement of the third and fourth metatarsals, raising concern for Lisfranc injury.  An os trigonum is noted.  A small posterior calcaneal spur is incidentally seen.  IMPRESSION:  1.  Significantly displaced oblique fracture extending across the distal fibula, with lateral and posterior  displacement and angulation.  Suspect underlying posterior malleolar fracture, with minimal displacement.  Associated significant displacement and subluxation of the talar dome, with lateral and posterior angulation. 2.  Fractures through the proximal second, third and fourth metatarsals, with question of a fracture through the proximal fifth metatarsal.  These are difficult to fully assess on ankle radiographs; foot radiographs could be considered for further evaluation.  Lateral displacement of the third and fourth metatarsals raises concern for Lisfranc injury. 3.  Os trigonum noted.   Original Report Authenticated By: Tonia Ghent, M.D.     No diagnosis found.    MDM  Fracture/dislocation as above. Discussed reduction with patient including procedural sedation and she consents (see procedure above). Splint placed by myself, assisted by EMT. Good circulation on post-splint recheck. Sent for CT to further characterize ankle and foot fractures seen on plain film to help guide possible surgical intervention and disposition. Care signed out to Dr. Rhunette Croft pending CT results. Pain is controlled at present.       I personally performed the services described in this documentation, which was scribed in my presence. The recorded information has been reviewed and is accurate.     Tirza Senteno B. Bernette Mayers, MD 11/16/12 2359

## 2012-11-16 NOTE — ED Notes (Signed)
Fell down 3 steps 30 minutes ago. Injury to her right ankle. Swelling and obvious injury noted. Ice on arrival. Pedal pulse palpated.

## 2012-11-17 MED ORDER — HYDROCODONE-ACETAMINOPHEN 5-325 MG PO TABS: 1.0000 | ORAL_TABLET | Freq: Four times a day (QID) | ORAL | Status: DC | PRN

## 2012-11-17 MED ORDER — HYDROCODONE-ACETAMINOPHEN 5-325 MG PO TABS
1.0000 | ORAL_TABLET | Freq: Once | ORAL | Status: AC
  Administered 2012-11-17: 1 via ORAL
  Filled 2012-11-17: qty 1

## 2012-11-17 MED ORDER — IBUPROFEN 600 MG PO TABS: 600.0000 mg | ORAL_TABLET | Freq: Four times a day (QID) | ORAL | Status: DC | PRN

## 2012-11-17 NOTE — ED Notes (Signed)
Pt assisted to car by Lequita Halt, EMT. Pt stable at d/c. Instructions given regarding Cast Care and ortho f/u.  Voiced understanding. Crutch education provided by Lequita Halt, EMT

## 2012-11-17 NOTE — ED Notes (Signed)
Awaiting CT

## 2012-11-17 NOTE — ED Notes (Signed)
Returned from CT.

## 2012-11-17 NOTE — ED Notes (Signed)
Work note/school note given to pt for her son and boyfriend per her request.

## 2012-11-18 ENCOUNTER — Ambulatory Visit (HOSPITAL_COMMUNITY)
Admission: RE | Admit: 2012-11-18 | Discharge: 2012-11-19 | Disposition: A | Discharge status: Home / Self Care | Payer: BC Managed Care – PPO | Source: Ambulatory Visit | Attending: Orthopedic Surgery | Admitting: Orthopedic Surgery

## 2012-11-18 ENCOUNTER — Encounter (HOSPITAL_COMMUNITY): Payer: Self-pay | Admitting: Anesthesiology

## 2012-11-18 ENCOUNTER — Ambulatory Visit (HOSPITAL_COMMUNITY): Payer: BC Managed Care – PPO | Admitting: Anesthesiology

## 2012-11-18 ENCOUNTER — Encounter (HOSPITAL_COMMUNITY): Payer: Self-pay | Admitting: *Deleted

## 2012-11-18 ENCOUNTER — Encounter (HOSPITAL_COMMUNITY)
Admission: RE | Disposition: A | Discharge status: Home / Self Care | Payer: Self-pay | Source: Ambulatory Visit | Attending: Orthopedic Surgery

## 2012-11-18 ENCOUNTER — Other Ambulatory Visit (HOSPITAL_COMMUNITY): Payer: Self-pay | Admitting: Orthopedic Surgery

## 2012-11-18 DIAGNOSIS — S92309A Fracture of unspecified metatarsal bone(s), unspecified foot, initial encounter for closed fracture: Secondary | ICD-10-CM | POA: Insufficient documentation

## 2012-11-18 DIAGNOSIS — Y92009 Unspecified place in unspecified non-institutional (private) residence as the place of occurrence of the external cause: Secondary | ICD-10-CM | POA: Insufficient documentation

## 2012-11-18 DIAGNOSIS — S8263XA Displaced fracture of lateral malleolus of unspecified fibula, initial encounter for closed fracture: Secondary | ICD-10-CM | POA: Insufficient documentation

## 2012-11-18 DIAGNOSIS — S93324A Dislocation of tarsometatarsal joint of right foot, initial encounter: Secondary | ICD-10-CM

## 2012-11-18 DIAGNOSIS — W108XXA Fall (on) (from) other stairs and steps, initial encounter: Secondary | ICD-10-CM | POA: Insufficient documentation

## 2012-11-18 DIAGNOSIS — S82891A Other fracture of right lower leg, initial encounter for closed fracture: Secondary | ICD-10-CM

## 2012-11-18 HISTORY — DX: Unspecified fracture of right lower leg, initial encounter for closed fracture: S82.91XA

## 2012-11-18 HISTORY — PX: ORIF FIBULA FRACTURE: SHX5114

## 2012-11-18 LAB — CBC
HCT: 34.2 % — ABNORMAL LOW (ref 36.0–46.0)
Hemoglobin: 11.3 g/dL — ABNORMAL LOW (ref 12.0–15.0)
MCH: 28.7 pg (ref 26.0–34.0)
MCHC: 33 g/dL (ref 30.0–36.0)
MCV: 86.8 fL (ref 78.0–100.0)
Platelets: 274 K/uL (ref 150–400)
RBC: 3.94 MIL/uL (ref 3.87–5.11)
RDW: 12.8 % (ref 11.5–15.5)
WBC: 8.7 K/uL (ref 4.0–10.5)

## 2012-11-18 LAB — SURGICAL PCR SCREEN: MRSA, PCR: NEGATIVE

## 2012-11-18 LAB — HCG, SERUM, QUALITATIVE: Preg, Serum: NEGATIVE

## 2012-11-18 SURGERY — OPEN REDUCTION INTERNAL FIXATION (ORIF) FIBULA FRACTURE
Anesthesia: General | Site: Ankle | Laterality: Right | Wound class: Clean

## 2012-11-18 MED ORDER — HYDROMORPHONE HCL PF 1 MG/ML IJ SOLN
0.2500 mg | INTRAMUSCULAR | Status: DC | PRN
  Administered 2012-11-18 (×3): 0.5 mg via INTRAVENOUS

## 2012-11-18 MED ORDER — ONDANSETRON HCL 4 MG/2ML IJ SOLN: 4.0000 mg | Freq: Four times a day (QID) | INTRAMUSCULAR | Status: DC | PRN

## 2012-11-18 MED ORDER — HYDROMORPHONE HCL PF 1 MG/ML IJ SOLN
INTRAMUSCULAR | Status: AC
  Filled 2012-11-18: qty 1

## 2012-11-18 MED ORDER — FENTANYL CITRATE 0.05 MG/ML IJ SOLN
INTRAMUSCULAR | Status: AC
  Administered 2012-11-18: 100 ug
  Filled 2012-11-18: qty 2

## 2012-11-18 MED ORDER — CEFAZOLIN SODIUM 1-5 GM-% IV SOLN
1.0000 g | Freq: Four times a day (QID) | INTRAVENOUS | Status: AC
  Administered 2012-11-19 (×3): 1 g via INTRAVENOUS
  Filled 2012-11-18 (×3): qty 50

## 2012-11-18 MED ORDER — CEFAZOLIN SODIUM-DEXTROSE 2-3 GM-% IV SOLR: 2.0000 g | INTRAVENOUS | Status: DC

## 2012-11-18 MED ORDER — LACTATED RINGERS IV SOLN
INTRAVENOUS | Status: DC | PRN
  Administered 2012-11-18: 18:00:00 via INTRAVENOUS

## 2012-11-18 MED ORDER — LIDOCAINE HCL (CARDIAC) 20 MG/ML IV SOLN
INTRAVENOUS | Status: DC | PRN
  Administered 2012-11-18: 100 mg via INTRAVENOUS

## 2012-11-18 MED ORDER — BUPIVACAINE-EPINEPHRINE PF 0.5-1:200000 % IJ SOLN
INTRAMUSCULAR | Status: DC | PRN
  Administered 2012-11-18: 30 mL

## 2012-11-18 MED ORDER — METOCLOPRAMIDE HCL 5 MG PO TABS
5.0000 mg | ORAL_TABLET | Freq: Three times a day (TID) | ORAL | Status: DC | PRN
  Filled 2012-11-18: qty 2

## 2012-11-18 MED ORDER — PROPOFOL 10 MG/ML IV BOLUS
INTRAVENOUS | Status: DC | PRN
  Administered 2012-11-18: 200 mg via INTRAVENOUS

## 2012-11-18 MED ORDER — ASPIRIN EC 325 MG PO TBEC
325.0000 mg | DELAYED_RELEASE_TABLET | Freq: Every day | ORAL | Status: DC
  Administered 2012-11-18 – 2012-11-19 (×2): 325 mg via ORAL
  Filled 2012-11-18: qty 1

## 2012-11-18 MED ORDER — HYDROMORPHONE HCL PF 1 MG/ML IJ SOLN
0.5000 mg | INTRAMUSCULAR | Status: DC | PRN
  Administered 2012-11-19 (×4): 1 mg via INTRAVENOUS
  Filled 2012-11-18 (×4): qty 1

## 2012-11-18 MED ORDER — METOCLOPRAMIDE HCL 5 MG/ML IJ SOLN
5.0000 mg | Freq: Three times a day (TID) | INTRAMUSCULAR | Status: DC | PRN
  Filled 2012-11-18: qty 2

## 2012-11-18 MED ORDER — HYDROCODONE-ACETAMINOPHEN 5-325 MG PO TABS
1.0000 | ORAL_TABLET | ORAL | Status: DC | PRN
  Administered 2012-11-19: 2 via ORAL
  Filled 2012-11-18: qty 2

## 2012-11-18 MED ORDER — MIDAZOLAM HCL 2 MG/2ML IJ SOLN
INTRAMUSCULAR | Status: AC
  Administered 2012-11-18: 2 mg
  Filled 2012-11-18: qty 2

## 2012-11-18 MED ORDER — MUPIROCIN 2 % EX OINT
TOPICAL_OINTMENT | CUTANEOUS | Status: AC
  Filled 2012-11-18: qty 22

## 2012-11-18 MED ORDER — OXYCODONE-ACETAMINOPHEN 5-325 MG PO TABS
1.0000 | ORAL_TABLET | ORAL | Status: DC | PRN
  Administered 2012-11-19 (×3): 2 via ORAL
  Filled 2012-11-18 (×3): qty 2

## 2012-11-18 MED ORDER — OXYCODONE HCL 5 MG PO TABS
ORAL_TABLET | ORAL | Status: AC
  Filled 2012-11-18: qty 1

## 2012-11-18 MED ORDER — ONDANSETRON HCL 4 MG PO TABS: 4.0000 mg | ORAL_TABLET | Freq: Four times a day (QID) | ORAL | Status: DC | PRN

## 2012-11-18 MED ORDER — ONDANSETRON HCL 4 MG/2ML IJ SOLN
INTRAMUSCULAR | Status: DC | PRN
  Administered 2012-11-18: 4 mg via INTRAVENOUS

## 2012-11-18 MED ORDER — SODIUM CHLORIDE 0.9 % IV SOLN
INTRAVENOUS | Status: DC
  Administered 2012-11-18: 20 mL via INTRAVENOUS

## 2012-11-18 MED ORDER — LACTATED RINGERS IV SOLN
INTRAVENOUS | Status: DC
  Administered 2012-11-18: 18:00:00 via INTRAVENOUS

## 2012-11-18 MED ORDER — 0.9 % SODIUM CHLORIDE (POUR BTL) OPTIME
TOPICAL | Status: DC | PRN
  Administered 2012-11-18: 1000 mL

## 2012-11-18 MED ORDER — FENTANYL CITRATE 0.05 MG/ML IJ SOLN
INTRAMUSCULAR | Status: DC | PRN
  Administered 2012-11-18: 50 ug via INTRAVENOUS
  Administered 2012-11-18: 100 ug via INTRAVENOUS

## 2012-11-18 MED ORDER — OXYCODONE HCL 5 MG/5ML PO SOLN: 5.0000 mg | Freq: Once | ORAL | Status: AC | PRN

## 2012-11-18 MED ORDER — OXYCODONE HCL 5 MG PO TABS
5.0000 mg | ORAL_TABLET | Freq: Once | ORAL | Status: AC | PRN
  Administered 2012-11-18: 5 mg via ORAL

## 2012-11-18 MED ORDER — CEFAZOLIN SODIUM-DEXTROSE 2-3 GM-% IV SOLR
INTRAVENOUS | Status: AC
  Administered 2012-11-18: 2 g via INTRAVENOUS
  Filled 2012-11-18: qty 50

## 2012-11-18 SURGICAL SUPPLY — 61 items
BANDAGE ESMARK 6X9 LF (GAUZE/BANDAGES/DRESSINGS) IMPLANT
BANDAGE GAUZE ELAST BULKY 4 IN (GAUZE/BANDAGES/DRESSINGS) ×2 IMPLANT
BIT DRILL 2.5X2.75 QC CALB (BIT) ×2 IMPLANT
BIT DRILL 2.9 CANN QC NONSTRL (BIT) ×2 IMPLANT
BNDG COHESIVE 4X5 TAN STRL (GAUZE/BANDAGES/DRESSINGS) ×2 IMPLANT
BNDG ESMARK 6X9 LF (GAUZE/BANDAGES/DRESSINGS)
BNDG GAUZE STRTCH 6 (GAUZE/BANDAGES/DRESSINGS) IMPLANT
CLOTH BEACON ORANGE TIMEOUT ST (SAFETY) ×2 IMPLANT
COVER SURGICAL LIGHT HANDLE (MISCELLANEOUS) ×2 IMPLANT
CUFF TOURNIQUET SINGLE 34IN LL (TOURNIQUET CUFF) IMPLANT
CUFF TOURNIQUET SINGLE 44IN (TOURNIQUET CUFF) IMPLANT
DRAPE C-ARM MINI 42X72 WSTRAPS (DRAPES) ×2 IMPLANT
DRAPE INCISE IOBAN 66X45 STRL (DRAPES) ×2 IMPLANT
DRAPE PROXIMA HALF (DRAPES) ×2 IMPLANT
DRAPE U-SHAPE 47X51 STRL (DRAPES) ×2 IMPLANT
DRSG ADAPTIC 3X8 NADH LF (GAUZE/BANDAGES/DRESSINGS) ×2 IMPLANT
DRSG PAD ABDOMINAL 8X10 ST (GAUZE/BANDAGES/DRESSINGS) ×2 IMPLANT
DURAPREP 26ML APPLICATOR (WOUND CARE) ×2 IMPLANT
ELECT REM PT RETURN 9FT ADLT (ELECTROSURGICAL) ×2
ELECTRODE REM PT RTRN 9FT ADLT (ELECTROSURGICAL) ×1 IMPLANT
GLOVE BIO SURGEON STRL SZ 6.5 (GLOVE) ×2 IMPLANT
GLOVE BIO SURGEON STRL SZ7 (GLOVE) ×4 IMPLANT
GLOVE BIOGEL PI IND STRL 9 (GLOVE) ×1 IMPLANT
GLOVE BIOGEL PI INDICATOR 9 (GLOVE) ×1
GLOVE SURG ORTHO 9.0 STRL STRW (GLOVE) ×2 IMPLANT
GLOVE SURG SS PI 7.0 STRL IVOR (GLOVE) ×2 IMPLANT
GOWN PREVENTION PLUS XLARGE (GOWN DISPOSABLE) ×2 IMPLANT
GOWN SRG XL XLNG 56XLVL 4 (GOWN DISPOSABLE) ×2 IMPLANT
GOWN STRL NON-REIN LRG LVL3 (GOWN DISPOSABLE) ×2 IMPLANT
GOWN STRL NON-REIN XL XLG LVL4 (GOWN DISPOSABLE) ×2
K-WIRE ACE 1.6X6 (WIRE) ×4
KIT BASIN OR (CUSTOM PROCEDURE TRAY) ×2 IMPLANT
KIT ROOM TURNOVER OR (KITS) ×2 IMPLANT
KWIRE ACE 1.6X6 (WIRE) ×2 IMPLANT
MANIFOLD NEPTUNE II (INSTRUMENTS) ×2 IMPLANT
NS IRRIG 1000ML POUR BTL (IV SOLUTION) ×2 IMPLANT
PACK ORTHO EXTREMITY (CUSTOM PROCEDURE TRAY) ×2 IMPLANT
PAD ARMBOARD 7.5X6 YLW CONV (MISCELLANEOUS) ×4 IMPLANT
PADDING CAST COTTON 6X4 STRL (CAST SUPPLIES) ×2 IMPLANT
PLATE LOCK 7H 92 BILAT FIB (Plate) ×2 IMPLANT
SCREW CORTICAL 3.5MM 22MM (Screw) ×2 IMPLANT
SCREW LAG  RD HEAD 4.0 36 LTH (Screw) ×1 IMPLANT
SCREW LAG  RD HEAD 4.0 40 LTH (Screw) IMPLANT
SCREW LAG  RD HEAD 4.0 46 LTH (Screw) ×1 IMPLANT
SCREW LAG RD HEAD 4.0 36 LTH (Screw) ×1 IMPLANT
SCREW LAG RD HEAD 4.0 40 LTH (Screw) IMPLANT
SCREW LAG RD HEAD 4.0 46 LTH (Screw) ×1 IMPLANT
SCREW LOCK CORT STAR 3.5X12 (Screw) ×2 IMPLANT
SCREW LOCK CORT STAR 3.5X14 (Screw) ×4 IMPLANT
SCREW LOW PROFILE 12MMX3.5MM (Screw) ×4 IMPLANT
SCREW NON LOCKING LP 3.5 14MM (Screw) ×2 IMPLANT
SPONGE GAUZE 4X4 12PLY (GAUZE/BANDAGES/DRESSINGS) ×2 IMPLANT
SPONGE LAP 18X18 X RAY DECT (DISPOSABLE) ×2 IMPLANT
STAPLER VISISTAT 35W (STAPLE) IMPLANT
SUCTION FRAZIER TIP 10 FR DISP (SUCTIONS) ×2 IMPLANT
SUT ETHILON 2 0 PSLX (SUTURE) ×4 IMPLANT
SUT VIC AB 2-0 CTB1 (SUTURE) ×4 IMPLANT
TOWEL OR 17X24 6PK STRL BLUE (TOWEL DISPOSABLE) ×2 IMPLANT
TOWEL OR 17X26 10 PK STRL BLUE (TOWEL DISPOSABLE) ×2 IMPLANT
TUBE CONNECTING 12X1/4 (SUCTIONS) ×2 IMPLANT
WATER STERILE IRR 1000ML POUR (IV SOLUTION) IMPLANT

## 2012-11-18 NOTE — Anesthesia Procedure Notes (Addendum)
Procedure Name: LMA Insertion Date/Time: 11/18/2012 6:40 PM Performed by: Coralee Rud Pre-anesthesia Checklist: Patient identified, Emergency Drugs available, Suction available and Timeout performed Patient Re-evaluated:Patient Re-evaluated prior to inductionOxygen Delivery Method: Circle system utilized Preoxygenation: Pre-oxygenation with 100% oxygen Intubation Type: IV induction Ventilation: Mask ventilation without difficulty LMA: LMA inserted LMA Size: 4.0 Number of attempts: 1 Placement Confirmation: positive ETCO2 Tube secured with: Tape   Anesthesia Regional Block:  Popliteal block  Pre-Anesthetic Checklist: ,, timeout performed, Correct Patient, Correct Site, Correct Laterality, Correct Procedure, Correct Position, site marked, Risks and benefits discussed,  Surgical consent,  Pre-op evaluation,  At surgeon's request and post-op pain management  Laterality: Right and Lower  Prep: chloraprep       Needles:  Injection technique: Single-shot  Needle Type: Echogenic Needle     Needle Length: 9cm  Needle Gauge: 21    Additional Needles:  Procedures: ultrasound guided (picture in chart) Popliteal block Narrative:  Start time: 11/18/2012 6:22 PM End time: 11/18/2012 6:29 PM Injection made incrementally with aspirations every 5 mL.  Performed by: Personally  Anesthesiologist: Sheldon Silvan, MD  Additional Notes: R Saphenous block given after Popliteal block with sterile prep and US guidance.  Popliteal block

## 2012-11-18 NOTE — Transfer of Care (Signed)
Immediate Anesthesia Transfer of Care Note  Patient: [name]  Procedure(s) Performed: Procedure(s) with comments: OPEN REDUCTION INTERNAL FIXATION (ORIF) FIBULA FRACTURE (Right) - Open Reduction Internal Fixation Right Fibula and Open Reduction Internal Fixation Right Lisfranc joint  Patient Location: PACU  Anesthesia Type:General  Level of Consciousness: awake, alert , oriented and patient cooperative  Airway & Oxygen Therapy: Patient Spontanous Breathing and Patient connected to nasal cannula oxygen  Post-op Assessment: Report given to PACU RN, Post -op Vital signs reviewed and stable and Patient moving all extremities  Post vital signs: Reviewed and stable  Complications: No apparent anesthesia complications

## 2012-11-18 NOTE — Anesthesia Postprocedure Evaluation (Signed)
  Anesthesia Post-op Note  Patient: [name]  Procedure(s) Performed: Procedure(s) with comments: OPEN REDUCTION INTERNAL FIXATION (ORIF) FIBULA FRACTURE (Right) - Open Reduction Internal Fixation Right Fibula and Open Reduction Internal Fixation Right Lisfranc joint  Patient Location: PACU  Anesthesia Type:GA combined with regional for post-op pain  Level of Consciousness: awake, alert  and oriented  Airway and Oxygen Therapy: Patient Spontanous Breathing  Post-op Pain: mild  Post-op Assessment: Post-op Vital signs reviewed  Post-op Vital Signs: Reviewed  Complications: No apparent anesthesia complications

## 2012-11-18 NOTE — Op Note (Signed)
OPERATIVE REPORT  DATE OF SURGERY: 11/18/2012  PATIENT:  [name],  32 y.o. female  PRE-OPERATIVE DIAGNOSIS:  Right Weber B fibula fracture, LisFranc fx/dislocation  POST-OPERATIVE DIAGNOSIS:  Right Weber B fibula fracture, LisFranc fx/dislocation  PROCEDURE:  Procedure(s): OPEN REDUCTION INTERNAL FIXATION (ORIF) FIBULA FRACTURE Open reduction internal fixation Lisfranc fracture dislocation  SURGEON:  Surgeon(s): Nadara Mustard, MD  ANESTHESIA:   regional and general  EBL:  min ML  SPECIMEN:  No Specimen  TOURNIQUET:  * No tourniquets in log *  PROCEDURE DETAILS: Patient is a 32 year old woman who fell down a flight of stairs sustaining a fracture dislocation of her right ankle as well as Lisfranc fracture dislocation of the right foot. Patient presents at this time for internal fixation. Risks and benefits were discussed including infection neurovascular injury pain nonhealing of the wound need for additional surgery. Patient states she understands and wished to proceed at this time. Description of procedure patient brought to the operating room and underwent a general anesthetic after a popliteal block. After adequate levels of anesthesia were obtained patient's right lower extremity was prepped using DuraPrep and draped into a sterile field. Attention was first focused and ankle. A lateral incision was made over the fibula this was carried down to the fibula subperiosteal dissection was used to cleanse the fracture fracture was reduced stabilized with a lag screw and then a anti-glide plate was placed laterally with 4 locking screws distally and 3 compression screws proximally. C-arm fluoroscopy verified a congruent mortise. The wound was irrigated with normal saline subcutaneous is closed using 0 Vicryl skin was closed using staples. Attention was then focused on the Lisfranc fracture dislocation. A dorsal incision was made down to the first webspace. Blunt dissection was proceeded  down to the base of the first metatarsal medial cuneiform. The joint was dislocated and unstable. A K wire was used to reduce the joint and a screw was placed to stabilize the first metatarsal medial cuneiform joint. The Lisfranc joint was then reduced and a guidewire was then placed from the medial cuneiform through the base of the second metatarsal. C-arm fluoroscopy verified alignment and a screw was then placed to stabilize the Lisfranc joint. C-arm fluoroscopy verified alignment of both AP and lateral planes. The wounds were irrigated with normal saline the skin incision was closed using 2-0 nylon. The wounds were covered with Adaptic orthopedic sponges AB dressing Kerlix and Coban. Patient was extubated taken to the PACU in stable condition.  PLAN OF CARE: Admit for overnight observation  PATIENT DISPOSITION:  PACU - hemodynamically stable.   Nadara Mustard, MD 11/18/2012 7:37 PM

## 2012-11-18 NOTE — Progress Notes (Signed)
Orthopedic Tech Progress Note Patient Details:  [name] 1981/01/29 [id number]  Ortho Devices Type of Ortho Device: CAM walker Ortho Device/Splint Location: RLE Ortho Device/Splint Interventions: Ordered;Application;Adjustment   [name] 11/18/2012, 10:22 PM

## 2012-11-18 NOTE — Anesthesia Preprocedure Evaluation (Signed)
Anesthesia Evaluation  Patient identified by MRN, date of birth, ID band Patient awake    Reviewed: Allergy & Precautions, H&P , NPO status , Patient's Chart, lab work & pertinent test results  Airway Mallampati: II  Neck ROM: full    Dental   Pulmonary          Cardiovascular     Neuro/Psych    GI/Hepatic   Endo/Other    Renal/GU      Musculoskeletal   Abdominal   Peds  Hematology   Anesthesia Other Findings   Reproductive/Obstetrics                           Anesthesia Physical Anesthesia Plan  ASA: I  Anesthesia Plan: General   Post-op Pain Management:    Induction: Intravenous  Airway Management Planned: Oral ETT  Additional Equipment:   Intra-op Plan:   Post-operative Plan: Extubation in OR  Informed Consent: I have reviewed the patients History and Physical, chart, labs and discussed the procedure including the risks, benefits and alternatives for the proposed anesthesia with the patient or authorized representative who has indicated his/her understanding and acceptance.     Plan Discussed with: CRNA, Anesthesiologist and Surgeon  Anesthesia Plan Comments:         Anesthesia Quick Evaluation  

## 2012-11-18 NOTE — H&P (Signed)
[name] is an 32 y.o. female.   Chief Complaint: Weber B. right ankle fracture and Lisfranc fracture dislocation right foot HPI: [name] is a 32 year old woman who states [name] fell down a flight of steps sustaining the above mentioned injury. Denies any other,.  Past Medical History  Diagnosis Date  . Medical history non-contributory   . Leg fracture, right     Past Surgical History  Procedure Laterality Date  . No past surgeries    . Cesarean section N/A 09/11/2012    Procedure: CESAREAN SECTION;  Surgeon: Leslie Andrea, MD;  Location: [facility];  Service: Obstetrics;  Laterality: N/A;    History reviewed. No pertinent family history. Social History:  reports that [name] has never smoked. [name] does not have any smokeless tobacco history on file. [name] reports that [name] does not drink alcohol or use illicit drugs.  Allergies: No Known Allergies  Medications Prior to Admission  Medication Sig Dispense Refill  . acetaminophen (TYLENOL) 325 MG tablet Take 650 mg by mouth every 6 (six) hours as needed for pain.      Marland Kitchen HYDROcodone-acetaminophen (NORCO/VICODIN) 5-325 MG per tablet Take 1 tablet by mouth every 6 (six) hours as needed for pain.  15 tablet  0  . ibuprofen (ADVIL,MOTRIN) 600 MG tablet Take 1 tablet (600 mg total) by mouth every 6 (six) hours as needed.  30 tablet  0  . ibuprofen (ADVIL,MOTRIN) 600 MG tablet Take 1 tablet (600 mg total) by mouth every 6 (six) hours as needed for pain.  30 tablet  0  . oxyCODONE-acetaminophen (PERCOCET/ROXICET) 5-325 MG per tablet Take 1-2 tablets by mouth every 6 (six) hours as needed.  30 tablet  0  . Prenatal Vit-Fe Fumarate-FA (PRENATAL MULTIVITAMIN) TABS Take 1 tablet by mouth daily at 12 noon.        Results for orders placed during the hospital encounter of 11/18/12 (from the past 48 hour(s))  CBC     Status: Abnormal   Collection Time    11/18/12  4:19 PM      Result Value Range   WBC 8.7  4.0 - 10.5 K/uL   RBC 3.94  3.87 - 5.11 MIL/uL    Hemoglobin 11.3 (*) 12.0 - 15.0 g/dL   HCT 09.8 (*) 11.9 - 14.7 %   MCV 86.8  78.0 - 100.0 fL   MCH 28.7  26.0 - 34.0 pg   MCHC 33.0  30.0 - 36.0 g/dL   RDW 82.9  56.2 - 13.0 %   Platelets 274  150 - 400 K/uL  HCG, SERUM, QUALITATIVE     Status: None   Collection Time    11/18/12  4:19 PM      Result Value Range   Preg, Serum NEGATIVE  NEGATIVE   Comment:            THE SENSITIVITY OF THIS     METHODOLOGY IS >10 mIU/mL.   Dg Tibia/fibula Right  11/16/2012   *RADIOLOGY REPORT*  Clinical Data: Right lateral ankle and lower leg pain after falling down stairs.  RIGHT TIBIA AND FIBULA - 2 VIEW  Comparison: None.  Findings: There is question of a tiny fracture through the fibular head; this is difficult to characterize, and may simply be artifactual.  The lateral and posterior malleolar fractures are better characterized on concurrent ankle radiographs; there is associated significant displacement and angulation of the talar dome.  Soft tissue swelling is noted about the ankle.  Known metatarsal fractures  are better characterized on ankle radiographs. An os trigonum is noted.  The knee joint is grossly unremarkable in appearance.  No significant knee joint effusion is identified, though the lateral view is somewhat suboptimal.  IMPRESSION:  1.  Question of tiny fracture through the fibular head; this is difficult to characterize, and may simply be artifactual. 2.  Lateral and posterior malleolar fractures are better characterized on concurrent ankle radiographs; associated significant displacement and angulation of the talar dome. 3.  Known metatarsal fractures are better characterized on concurrent ankle radiographs.   Original Report Authenticated By: Tonia Ghent, M.D.   Dg Ankle Complete Right  11/16/2012   *RADIOLOGY REPORT*  Clinical Data: Status post fall down stairs; right lateral ankle and lower leg pain.  RIGHT ANKLE - COMPLETE 3+ VIEW  Comparison: None.  Findings: There is a  significantly displaced oblique fracture extending across the distal fibula, with lateral and posterior displacement and angulation.  There is suspicion of an underlying posterior malleolar fracture, demonstrating minimal displacement. Due to the displaced lateral malleolar fracture, there is displacement and subluxation of the talar dome, with lateral and posterior angulation.  The interosseous space appears grossly intact.  No definite medial malleolar fracture is seen.  Diffuse soft tissue swelling is noted about the ankle, most prominent anteriorly and laterally.  There also appears to be fractures through the proximal second, third and fourth metatarsals, with question of a fracture through the proximal fifth metatarsal.  These are difficult to fully assess on ankle radiographs.  There is lateral displacement of the third and fourth metatarsals, raising concern for Lisfranc injury.  An os trigonum is noted.  A small posterior calcaneal spur is incidentally seen.  IMPRESSION:  1.  Significantly displaced oblique fracture extending across the distal fibula, with lateral and posterior displacement and angulation.  Suspect underlying posterior malleolar fracture, with minimal displacement.  Associated significant displacement and subluxation of the talar dome, with lateral and posterior angulation. 2.  Fractures through the proximal second, third and fourth metatarsals, with question of a fracture through the proximal fifth metatarsal.  These are difficult to fully assess on ankle radiographs; foot radiographs could be considered for further evaluation.  Lateral displacement of the third and fourth metatarsals raises concern for Lisfranc injury. 3.  Os trigonum noted.   Original Report Authenticated By: Tonia Ghent, M.D.   Ct Ankle Right Wo Contrast  11/17/2012   *RADIOLOGY REPORT*  Clinical Data: Status post fall down steps; evaluate ankle and foot fractures.  CT OF THE RIGHT ANKLE WITH CONTRAST  Technique:   Multidetector CT imaging was performed following the standard protocol during bolus administration of intravenous contrast.  Comparison: None.  Findings: There are mildly comminuted fractures involving the bases of the second, third and fourth metatarsals, with intra-articular extension at the second metatarsal.  No significant comminution is noted at the fourth metatarsal fracture.  There is widening between the bases of the second and third metatarsals, raising concern for some degree of Lisfranc injury.  Overlying soft tissue swelling is noted.  A comminuted distal fibular fracture is seen, with a dominant posteriorly and superiorly displaced lateral malleolar fragment. There is also a minimally displaced posterior malleolar fracture, demonstrating minimal comminution.  A tiny osseous fragment distal to the medial malleolus may reflect a small avulsion injury.  The ankle mortise is now appropriately aligned, though the lateral malleolar fragment nearly abuts the talar dome.  The subtalar joint is unremarkable in appearance.  An os trigonum is noted.  No additional fractures are seen.  The visualized flexor and extensor tendons are grossly unremarkable in appearance.  The peroneal tendons are grossly unremarkable.  An ankle joint effusion is noted.  The Achilles tendon remains intact.  The plantar fascia is grossly unremarkable in appearance.  The sinus tarsi appears grossly intact.  Dorsal soft tissue swelling is noted along the forefoot.  IMPRESSION:  1.  Fractures involving the bases of the second, third and fourth metatarsals, with intra-articular extension noted at the second metatarsal.  There is mild comminution of the second and third metatarsal fractures, with widening between the bases of the second and third metatarsals, raising concern for some degree of Lisfranc injury. 2.  Comminuted distal fibular fracture, with a dominant posteriorly and centrally displaced lateral malleolar fragment, nearly  abutting the talar dome.  The ankle mortise is now appropriately aligned. Minimally displaced posterior malleolar fracture demonstrates minimal comminution. 3.  Tiny osseous fragment distal to the medial malleolus may reflect a small avulsion injury.  No significant medial malleolar fracture seen.  4.  Ankle joint effusion noted.   Original Report Authenticated By: Tonia Ghent, M.D.    Review of Systems  All other systems reviewed and are negative.    Blood pressure 126/69, pulse 60, temperature 98.5 F (36.9 C), temperature source Oral, resp. rate 14, height 5\' 5"  (1.651 m), weight 80.287 kg (177 lb), SpO2 100.00%, not currently breastfeeding. Physical Exam  On examination [name] has palpable pulses [name] has swelling but there is no skin blisters no skin breakdown. Radiographs shows a dislocated ankle with fibula fracture Weber B. with posterior malleolar fracture as well. Radiographs of the foot shows Lisfranc fracture dislocation with fractures to the base of the second third and fourth metatarsals. Assessment/Plan Assessment: Right ankle Weber B. trimalleolar fracture with right foot Lisfranc fracture dislocation with fractures across the base of the second third and fourth metatarsals.  Plan: We'll plan for open reduction internal fixation of the Lisfranc joint and fibular fracture. Risks and benefits were discussed including infection neurovascular injury pain need for additional surgery. [name] states [name] understands and wished to proceed at this time.  Paulmichael Schreck V 11/18/2012, 6:12 PM

## 2012-11-18 NOTE — Progress Notes (Signed)
Report given to Nadine Counts, CRNA

## 2012-11-19 MED ORDER — OXYCODONE-ACETAMINOPHEN 5-325 MG PO TABS: 1.0000 | ORAL_TABLET | ORAL | Status: DC | PRN

## 2012-11-19 NOTE — Progress Notes (Signed)
DC orders received.  Patient stable with no S/S of distress.  Medication and discharge information reviewed with patient and patient's significant other.  Patient DC home with rolling walker. Dickens, Mitzi Hansen

## 2012-11-19 NOTE — Evaluation (Signed)
Physical Therapy Evaluation Patient Details Name: [name] MRN: [id number] DOB: September 12, 1980 Today's Date: 11/19/2012 Time: 4098-1191 PT Time Calculation (min): 35 min  PT Assessment / Plan / Recommendation Clinical Impression  Pt is a 32 yo female s/p R ankle ORIF due to a fall. Pt is mod. Independent with basic mobility using a cam boot NWB with a RW. Pt will need an order for a RW for home use and recommned outpatient PT when orthopedic clears pt for ankle ROM. Pt is safet o d/c home today after receiving RW and pt is D/C from PT services.    PT Assessment  Patent does not need any further PT services    Follow Up Recommendations  Outpatient PT (when orth clears for ankle ROM)    Does the patient have the potential to tolerate intense rehabilitation      Barriers to Discharge        Equipment Recommendations  Rolling walker with 5" wheels    Recommendations for Other Services     Frequency      Precautions / Restrictions Precautions Required Braces or Orthoses: Other Brace/Splint Other Brace/Splint: cam boot Restrictions Weight Bearing Restrictions: Yes RLE Weight Bearing: Non weight bearing   Pertinent Vitals/Pain       Mobility  Bed Mobility Bed Mobility: Supine to Sit Supine to Sit: 7: Independent Transfers Transfers: Sit to Stand;Stand to Sit Sit to Stand: 6: Modified independent (Device/Increase time) Stand to Sit: 6: Modified independent (Device/Increase time) Details for Transfer Assistance: pt able to maintain NWB with cam boot. Ambulation/Gait Ambulation/Gait Assistance: 6: Modified independent (Device/Increase time) Ambulation Distance (Feet): 30 Feet Assistive device: Rolling walker Gait Pattern: Step-to pattern Gait velocity: good for precautions Stairs: No (discussed technique)    Exercises     PT Diagnosis:    PT Problem List:   PT Treatment Interventions:     PT Goals    Visit Information  Last PT Received On: 11/19/12     Subjective Data  Subjective: "I used crutches before and really do not like them." Patient Stated Goal: To return home.   Prior Functioning  Home Living Lives With: Significant other Available Help at Discharge: Family Type of Home: House Home Access: Level entry Home Layout: Two level Alternate Level Stairs-Number of Steps: 12 Home Adaptive Equipment: Crutches Prior Function Level of Independence: Independent Able to Take Stairs?: Yes Driving: Yes Communication Communication: No difficulties    Cognition  Cognition Arousal/Alertness: Awake/alert Behavior During Therapy: WFL for tasks assessed/performed Overall Cognitive Status: Within Functional Limits for tasks assessed    Extremity/Trunk Assessment Right Lower Extremity Assessment RLE ROM/Strength/Tone: Unable to fully assess;Due to precautions Left Lower Extremity Assessment LLE ROM/Strength/Tone: Within functional levels Trunk Assessment Trunk Assessment: Normal   Balance Balance Balance Assessed: Yes Static Standing Balance Static Standing - Balance Support: No upper extremity supported Static Standing - Level of Assistance: 6: Modified independent (Device/Increase time)  End of Session PT - End of Session Equipment Utilized During Treatment: Gait belt;Other (comment) (cam boot) Activity Tolerance: Patient tolerated treatment well Patient left: in bed;with call bell/phone within reach;with family/visitor present Nurse Communication: Mobility status  GP Functional Assessment Tool Used: clinical judgement Functional Limitation: Mobility: Walking and moving around Mobility: Walking and Moving Around Current Status ([id number]): At least 1 percent but less than 20 percent impaired, limited or restricted Mobility: Walking and Moving Around Goal Status [phone]): At least 1 percent but less than 20 percent impaired, limited or restricted Mobility: Walking and Moving Around Discharge  Status [phone]): At least 1 percent but  less than 20 percent impaired, limited or restricted   Greggory Stallion 11/19/2012, 8:44 AM

## 2012-11-19 NOTE — Care Management Note (Signed)
    Page 1 of 1   11/19/2012     10:58:39 AM   CARE MANAGEMENT NOTE 11/19/2012  Patient:  [name], [name]   Account Number:  [id number]  Date Initiated:  11/19/2012  Documentation initiated by:  GRAVES-BIGELOW,Dalilah Curlin  Subjective/Objective Assessment:   Pt admitted for s/p R ankle ORIF due to a fall.     Action/Plan:   Pt will be d/c today and RW will be delivered to room. No outpatient physical therapy will be set up at this time until when orth clears for ankle ROM). The office will set up the appointment.   Anticipated DC Date:  11/19/2012   Anticipated DC Plan:  HOME/SELF CARE      DC Planning Services  CM consult      Choice offered to / List presented to:     DME arranged  Levan Hurst      DME agency  [facility]        Status of service:  Completed, signed off Medicare Important Message given?   (If response is "NO", the following Medicare IM given date fields will be blank) Date Medicare IM given:   Date Additional Medicare IM given:    Discharge Disposition:  HOME/SELF CARE  Per UR Regulation:  Reviewed for med. necessity/level of care/duration of stay  If discussed at Long Length of Stay Meetings, dates discussed:    Comments:

## 2012-11-19 NOTE — Discharge Summary (Signed)
  Discharge to home. Diagnosis right ankle Weber B. fibular fracture with Lisfranc fracture dislocation of the foot. Status post open reduction internal fixation of the ankle and Lisfranc joint.

## 2012-11-19 NOTE — Progress Notes (Addendum)
MD on call notified of pt being unable to dorsiflex her foot. Verbal orders to reassess the site, make sure the extremity is elevated & iced, vascular assessments & redress the dressing. Upon further assessment the pt had a +2 popliteal pulse; WNL capillary refill of her toes & she was able to wiggle them. Pt stated she had sensation every where besides her ankle & that earlier the sensation was decreased near her knee but now it's just at her ankle after receiving a popliteal block prior to arrival to the floor.  Will continue to monitor the pt & keep the surgeon informed with any changes. Sanda Linger

## 2012-11-20 ENCOUNTER — Encounter (HOSPITAL_COMMUNITY): Payer: Self-pay | Admitting: Orthopedic Surgery

## 2014-04-25 ENCOUNTER — Encounter (HOSPITAL_COMMUNITY): Payer: Self-pay | Admitting: Orthopedic Surgery

## 2014-06-24 NOTE — L&D Delivery Note (Signed)
SVD of VMI at 2323 on 05/22/15.  EBL 350cc.  Placenta to pathology.   Head delivered LOA with loose nuchal x 1 reduced.  Body delivered atraumatically.  Cord was clamped and cut.  Code APGAR was called as baby had poor tone and color; improved with vigorous stimulation at warmer.  Cord blood was obtained.  Placenta delivered S/I/3VC.  Fundus was firmed with pitocin and massage.  Perineum intact.  Bilateral periurethral lacs were repaired with 3-0 Rapide.  Mom and baby stable.   Mitchel HonourMegan Isaah Furry, DO

## 2014-10-20 LAB — OB RESULTS CONSOLE RUBELLA ANTIBODY, IGM: Rubella: IMMUNE

## 2014-10-20 LAB — OB RESULTS CONSOLE RPR: RPR: NONREACTIVE

## 2014-10-20 LAB — OB RESULTS CONSOLE ANTIBODY SCREEN: Antibody Screen: NEGATIVE

## 2014-10-20 LAB — OB RESULTS CONSOLE ABO/RH: RH Type: POSITIVE

## 2014-10-20 LAB — OB RESULTS CONSOLE HIV ANTIBODY (ROUTINE TESTING): HIV: NONREACTIVE

## 2014-10-20 LAB — OB RESULTS CONSOLE HEPATITIS B SURFACE ANTIGEN: Hepatitis B Surface Ag: NEGATIVE

## 2014-10-31 ENCOUNTER — Inpatient Hospital Stay (HOSPITAL_COMMUNITY)
Admission: AD | Admit: 2014-10-31 | Discharge: 2014-10-31 | Disposition: A | Discharge status: Home / Self Care | Payer: BC Managed Care – PPO | Source: Ambulatory Visit | Attending: Obstetrics and Gynecology | Admitting: Obstetrics and Gynecology

## 2014-10-31 ENCOUNTER — Encounter (HOSPITAL_COMMUNITY): Payer: Self-pay

## 2014-10-31 DIAGNOSIS — O219 Vomiting of pregnancy, unspecified: Secondary | ICD-10-CM

## 2014-10-31 DIAGNOSIS — Z3A10 10 weeks gestation of pregnancy: Secondary | ICD-10-CM | POA: Insufficient documentation

## 2014-10-31 DIAGNOSIS — Z3A09 9 weeks gestation of pregnancy: Secondary | ICD-10-CM

## 2014-10-31 DIAGNOSIS — O210 Mild hyperemesis gravidarum: Secondary | ICD-10-CM | POA: Diagnosis present

## 2014-10-31 LAB — URINE MICROSCOPIC-ADD ON

## 2014-10-31 LAB — URINALYSIS, ROUTINE W REFLEX MICROSCOPIC
BILIRUBIN URINE: NEGATIVE
Glucose, UA: NEGATIVE mg/dL
KETONES UR: NEGATIVE mg/dL
LEUKOCYTES UA: NEGATIVE
NITRITE: NEGATIVE
PH: 6 (ref 5.0–8.0)
Protein, ur: NEGATIVE mg/dL
UROBILINOGEN UA: 0.2 mg/dL (ref 0.0–1.0)

## 2014-10-31 MED ORDER — PROMETHAZINE HCL 25 MG/ML IJ SOLN
25.0000 mg | Freq: Once | INTRAMUSCULAR | Status: AC
  Administered 2014-10-31: 25 mg via INTRAMUSCULAR
  Filled 2014-10-31: qty 1

## 2014-10-31 MED ORDER — PROMETHAZINE HCL 25 MG PO TABS: 12.5000 mg | ORAL_TABLET | Freq: Four times a day (QID) | ORAL | Status: DC | PRN

## 2014-10-31 NOTE — MAU Note (Signed)
Vomiting today, unable to keep anything down. Vomited x 4 today. Due date 12/8 per Dr. Vincente PoliGrewal. Denies vaginal bleeding or discharge. Abdominal pain; some lower, some upper, feels like some of the pain is "hunger pain".

## 2014-10-31 NOTE — Discharge Instructions (Signed)

## 2014-10-31 NOTE — MAU Provider Note (Signed)
History     CSN: [id number]  Arrival date and time: 10/31/14 1958   First Provider Initiated Contact with Patient 10/31/14 2035      Chief Complaint  Patient presents with  . Dehydration   HPI Comments: [name] is a 34 y.o. G3P2002 at 71100w4d who presents today with nausea and vomiting. She states that she has been nauseous since she found out she was pregnant. She has been taking diclegis, but it has not been helping. She states that she has been able to drink Gatorade today, and that has helped her feel better. She does not have any other antiemetics to try.   Emesis  This is a new problem. The current episode started yesterday. The problem has been unchanged. There has been no fever. Pertinent negatives include no abdominal pain, diarrhea or fever. Risk factors: pregnancy  Treatments tried: diclegis  The treatment provided no relief.     Past Medical History  Diagnosis Date  . Medical history non-contributory   . Leg fracture, right     Past Surgical History  Procedure Laterality Date  . No past surgeries    . Cesarean section N/A 09/11/2012    Procedure: CESAREAN SECTION;  Surgeon: Leslie AndreaJames E Tomblin II, MD;  Location: [facility];  Service: Obstetrics;  Laterality: N/A;  . Orif fibula fracture Right 11/18/2012    Procedure: OPEN REDUCTION INTERNAL FIXATION (ORIF) FIBULA FRACTURE;  Surgeon: Nadara MustardMarcus V Duda, MD;  Location: [facility];  Service: Orthopedics;  Laterality: Right;  Open Reduction Internal Fixation Right Fibula and Open Reduction Internal Fixation Right Lisfranc joint    No family history on file.  History  Substance Use Topics  . Smoking status: Never Smoker   . Smokeless tobacco: Not on file  . Alcohol Use: No    Allergies: No Known Allergies  Prescriptions prior to admission  Medication Sig Dispense Refill Last Dose  . ampicillin (PRINCIPEN) 250 MG capsule Take 250 mg by mouth 4 (four) times daily.   10/30/2014 at Unknown time  . Doxylamine-Pyridoxine 10-10 MG TBEC  Take 1-2 tablets by mouth 2 (two) times daily. Take 1 tablet in the morning and take 2 tablets at bedtime.   10/30/2014 at Unknown time  . Prenatal Vit-Fe Fumarate-FA (PRENATAL MULTIVITAMIN) TABS tablet Take 1 tablet by mouth daily at 12 noon.   10/30/2014 at Unknown time  . acetaminophen (TYLENOL) 325 MG tablet Take 650 mg by mouth every 6 (six) hours as needed for pain.   more than one month  . HYDROcodone-acetaminophen (NORCO/VICODIN) 5-325 MG per tablet Take 1 tablet by mouth every 6 (six) hours as needed for pain. (Patient not taking: Reported on 10/31/2014) 15 tablet 0 Not Taking at Unknown time  . ibuprofen (ADVIL,MOTRIN) 600 MG tablet Take 1 tablet (600 mg total) by mouth every 6 (six) hours as needed for pain. (Patient not taking: Reported on 10/31/2014) 30 tablet 0 Not Taking at Unknown time  . oxyCODONE-acetaminophen (ROXICET) 5-325 MG per tablet Take 1 tablet by mouth every 4 (four) hours as needed for pain. (Patient not taking: Reported on 10/31/2014) 60 tablet 0 Not Taking at Unknown time    Review of Systems  Constitutional: Negative for fever.  Gastrointestinal: Positive for nausea and vomiting. Negative for abdominal pain, diarrhea and constipation.  Genitourinary: Negative for dysuria, urgency and frequency.   Physical Exam   Blood pressure 131/78, pulse 92, resp. rate 18, height 5\' 5"  (1.651 m), weight 84.641 kg (186 lb 9.6 oz), SpO2 100 %.  Physical Exam  MAU Course  Procedures  MDM 2211: Patient has had 25 mg phenergan IM, and she is now tolerating PO.   Assessment and Plan   1. Nausea/vomiting in pregnancy   2. [redacted] weeks gestation of pregnancy    DC home RX phenergan 12.5-25 mg PRN #30 Return to MAU as needed Comfort measures reviewed  Follow-up Information    Follow up with Turner DanielsLOWE,DAVID C, MD.   Specialty:  Obstetrics and Gynecology   Why:  As scheduled   Contact information:   [address], [address] [place] [id number] [phone]        Tawnya CrookHogan,  Elnathan Fulford Donovan 10/31/2014, 8:38 PM

## 2014-11-03 LAB — OB RESULTS CONSOLE GC/CHLAMYDIA
CHLAMYDIA, DNA PROBE: NEGATIVE
Gonorrhea: NEGATIVE

## 2014-11-03 LAB — OB RESULTS CONSOLE GBS: GBS: POSITIVE

## 2015-05-10 NOTE — H&P (Signed)
[name] is a 34 y.o. G 5 P 20[facility] presents for Repeat LTCS and BTL. History OB History    Gravida Para Term Preterm AB TAB SAB Ectopic Multiple Living   3 2 2       2      Past Medical History  Diagnosis Date  . Medical history non-contributory   . Leg fracture, right    Past Surgical History  Procedure Laterality Date  . No past surgeries    . Cesarean section N/A 09/11/2012    Procedure: CESAREAN SECTION;  Surgeon: Jenny AndreaJames E Tomblin II, MD;  Location: [facility];  Service: Obstetrics;  Laterality: N/A;  . Orif fibula fracture Right 11/18/2012    Procedure: OPEN REDUCTION INTERNAL FIXATION (ORIF) FIBULA FRACTURE;  Surgeon: Jenny MustardMarcus V Duda, MD;  Location: [facility];  Service: Orthopedics;  Laterality: Right;  Open Reduction Internal Fixation Right Fibula and Open Reduction Internal Fixation Right Lisfranc joint   Family History: family history is not on file. Social History:  reports that she has never smoked. She does not have any smokeless tobacco history on file. She reports that she does not drink alcohol or use illicit drugs.   Prenatal Transfer Tool  Maternal Diabetes: No Genetic Screening: Normal Maternal Ultrasounds/Referrals: Normal Fetal Ultrasounds or other Referrals:  None Maternal Substance Abuse:  No Significant Maternal Medications:  None Significant Maternal Lab Results:  None Other Comments:  None  Review of Systems  All other systems reviewed and are negative.     There were no vitals taken for this visit. Exam Physical Exam  Nursing note and vitals reviewed. Constitutional: She appears well-developed.  HENT:  Head: Normocephalic.  Neck: Normal range of motion.  Cardiovascular: Normal rate and regular rhythm.   Respiratory: Effort normal.    Prenatal labs: ABO, Rh:   Antibody:   Rubella:   RPR:    HBsAg:    HIV:    GBS:     Assessment/Plan: IUP at term Previous C Section Desires Permanent sterilization Repeat  LTCS BTL   [name] 05/10/2015, 9:12 AM

## 2015-05-22 ENCOUNTER — Encounter (HOSPITAL_COMMUNITY): Payer: Self-pay

## 2015-05-22 ENCOUNTER — Inpatient Hospital Stay (HOSPITAL_COMMUNITY): Payer: BC Managed Care – PPO | Admitting: Anesthesiology

## 2015-05-22 ENCOUNTER — Encounter (HOSPITAL_COMMUNITY): Payer: Self-pay | Admitting: *Deleted

## 2015-05-22 ENCOUNTER — Inpatient Hospital Stay (HOSPITAL_COMMUNITY)
Admission: AD | Admit: 2015-05-22 | Discharge: 2015-05-24 | DRG: 775 | Disposition: A | Discharge status: Home / Self Care | Payer: BC Managed Care – PPO | Source: Ambulatory Visit | Attending: Obstetrics & Gynecology | Admitting: Obstetrics & Gynecology

## 2015-05-22 DIAGNOSIS — Z5320 Procedure and treatment not carried out because of patient's decision for unspecified reasons: Secondary | ICD-10-CM | POA: Diagnosis present

## 2015-05-22 DIAGNOSIS — Z3A38 38 weeks gestation of pregnancy: Secondary | ICD-10-CM | POA: Diagnosis not present

## 2015-05-22 DIAGNOSIS — O99214 Obesity complicating childbirth: Secondary | ICD-10-CM | POA: Diagnosis present

## 2015-05-22 DIAGNOSIS — O34211 Maternal care for low transverse scar from previous cesarean delivery: Secondary | ICD-10-CM | POA: Diagnosis present

## 2015-05-22 DIAGNOSIS — O99824 Streptococcus B carrier state complicating childbirth: Secondary | ICD-10-CM | POA: Diagnosis present

## 2015-05-22 DIAGNOSIS — E669 Obesity, unspecified: Secondary | ICD-10-CM | POA: Diagnosis present

## 2015-05-22 DIAGNOSIS — O34219 Maternal care for unspecified type scar from previous cesarean delivery: Secondary | ICD-10-CM

## 2015-05-22 DIAGNOSIS — Z6834 Body mass index (BMI) 34.0-34.9, adult: Secondary | ICD-10-CM

## 2015-05-22 DIAGNOSIS — Z3490 Encounter for supervision of normal pregnancy, unspecified, unspecified trimester: Secondary | ICD-10-CM

## 2015-05-22 LAB — CBC
HCT: 34.9 % — ABNORMAL LOW (ref 36.0–46.0)
Hemoglobin: 11.7 g/dL — ABNORMAL LOW (ref 12.0–15.0)
MCH: 28.9 pg (ref 26.0–34.0)
MCHC: 33.5 g/dL (ref 30.0–36.0)
MCV: 86.2 fL (ref 78.0–100.0)
PLATELETS: 234 10*3/uL (ref 150–400)
RBC: 4.05 MIL/uL (ref 3.87–5.11)
RDW: 13.7 % (ref 11.5–15.5)
WBC: 8.3 10*3/uL (ref 4.0–10.5)

## 2015-05-22 LAB — COMPREHENSIVE METABOLIC PANEL
ALK PHOS: 156 U/L — AB (ref 38–126)
ALT: 23 U/L (ref 14–54)
ANION GAP: 8 (ref 5–15)
AST: 31 U/L (ref 15–41)
Albumin: 3 g/dL — ABNORMAL LOW (ref 3.5–5.0)
BILIRUBIN TOTAL: 0.7 mg/dL (ref 0.3–1.2)
BUN: <5 mg/dL — ABNORMAL LOW (ref 6–20)
CALCIUM: 8.8 mg/dL — AB (ref 8.9–10.3)
CO2: 21 mmol/L — ABNORMAL LOW (ref 22–32)
Chloride: 107 mmol/L (ref 101–111)
Creatinine, Ser: 0.71 mg/dL (ref 0.44–1.00)
Glucose, Bld: 81 mg/dL (ref 65–99)
Potassium: 3.7 mmol/L (ref 3.5–5.1)
Sodium: 136 mmol/L (ref 135–145)
TOTAL PROTEIN: 6.1 g/dL — AB (ref 6.5–8.1)

## 2015-05-22 LAB — ABO/RH: ABO/RH(D): O POS

## 2015-05-22 LAB — TYPE AND SCREEN
ABO/RH(D): O POS
Antibody Screen: NEGATIVE

## 2015-05-22 MED ORDER — OXYCODONE-ACETAMINOPHEN 5-325 MG PO TABS: 2.0000 | ORAL_TABLET | ORAL | Status: DC | PRN

## 2015-05-22 MED ORDER — LACTATED RINGERS IV SOLN
INTRAVENOUS | Status: DC
  Administered 2015-05-22: 11:00:00 via INTRAVENOUS

## 2015-05-22 MED ORDER — PENICILLIN G POTASSIUM 5000000 UNITS IJ SOLR
5.0000 10*6.[IU] | Freq: Once | INTRAVENOUS | Status: AC
  Administered 2015-05-22: 5 10*6.[IU] via INTRAVENOUS
  Filled 2015-05-22: qty 5

## 2015-05-22 MED ORDER — FENTANYL 2.5 MCG/ML BUPIVACAINE 1/10 % EPIDURAL INFUSION (WH - ANES)
14.0000 mL/h | INTRAMUSCULAR | Status: DC | PRN
  Administered 2015-05-22 (×2): 14 mL/h via EPIDURAL
  Filled 2015-05-22 (×2): qty 125

## 2015-05-22 MED ORDER — PHENYLEPHRINE 40 MCG/ML (10ML) SYRINGE FOR IV PUSH (FOR BLOOD PRESSURE SUPPORT)
80.0000 ug | PREFILLED_SYRINGE | INTRAVENOUS | Status: DC | PRN
  Filled 2015-05-22: qty 2
  Filled 2015-05-22: qty 20

## 2015-05-22 MED ORDER — LIDOCAINE HCL (PF) 1 % IJ SOLN
30.0000 mL | INTRAMUSCULAR | Status: DC | PRN
  Filled 2015-05-22: qty 30

## 2015-05-22 MED ORDER — LIDOCAINE HCL (PF) 1 % IJ SOLN
INTRAMUSCULAR | Status: DC | PRN
  Administered 2015-05-22 (×2): 8 mL via EPIDURAL

## 2015-05-22 MED ORDER — DIPHENHYDRAMINE HCL 50 MG/ML IJ SOLN: 12.5000 mg | INTRAMUSCULAR | Status: DC | PRN

## 2015-05-22 MED ORDER — PENICILLIN G POTASSIUM 5000000 UNITS IJ SOLR
2.5000 10*6.[IU] | INTRAVENOUS | Status: DC
  Administered 2015-05-22 (×3): 2.5 10*6.[IU] via INTRAVENOUS
  Filled 2015-05-22 (×7): qty 2.5

## 2015-05-22 MED ORDER — OXYCODONE-ACETAMINOPHEN 5-325 MG PO TABS: 1.0000 | ORAL_TABLET | ORAL | Status: DC | PRN

## 2015-05-22 MED ORDER — OXYTOCIN BOLUS FROM INFUSION
500.0000 mL | INTRAVENOUS | Status: DC
  Administered 2015-05-22: 500 mL via INTRAVENOUS

## 2015-05-22 MED ORDER — LACTATED RINGERS IV SOLN
500.0000 mL | INTRAVENOUS | Status: DC | PRN
  Administered 2015-05-22 (×2): 500 mL via INTRAVENOUS

## 2015-05-22 MED ORDER — ONDANSETRON HCL 4 MG/2ML IJ SOLN
4.0000 mg | Freq: Four times a day (QID) | INTRAMUSCULAR | Status: DC | PRN
  Administered 2015-05-22: 4 mg via INTRAVENOUS
  Filled 2015-05-22: qty 2

## 2015-05-22 MED ORDER — ACETAMINOPHEN 325 MG PO TABS
650.0000 mg | ORAL_TABLET | ORAL | Status: DC | PRN
  Administered 2015-05-22: 650 mg via ORAL
  Filled 2015-05-22: qty 2

## 2015-05-22 MED ORDER — CITRIC ACID-SODIUM CITRATE 334-500 MG/5ML PO SOLN: 30.0000 mL | ORAL | Status: DC | PRN

## 2015-05-22 MED ORDER — FLEET ENEMA 7-19 GM/118ML RE ENEM: 1.0000 | ENEMA | RECTAL | Status: DC | PRN

## 2015-05-22 MED ORDER — LACTATED RINGERS IV SOLN
INTRAVENOUS | Status: DC
  Administered 2015-05-22: 22:00:00 via INTRAUTERINE

## 2015-05-22 MED ORDER — EPHEDRINE 5 MG/ML INJ
10.0000 mg | INTRAVENOUS | Status: DC | PRN
  Filled 2015-05-22: qty 2

## 2015-05-22 MED ORDER — OXYTOCIN 40 UNITS IN LACTATED RINGERS INFUSION - SIMPLE MED
62.5000 mL/h | INTRAVENOUS | Status: DC
  Filled 2015-05-22: qty 1000

## 2015-05-22 MED ORDER — TERBUTALINE SULFATE 1 MG/ML IJ SOLN
0.2500 mg | Freq: Once | INTRAMUSCULAR | Status: DC | PRN
  Filled 2015-05-22: qty 1

## 2015-05-22 MED ORDER — OXYTOCIN 40 UNITS IN LACTATED RINGERS INFUSION - SIMPLE MED
1.0000 m[IU]/min | INTRAVENOUS | Status: DC
  Administered 2015-05-22: 1 m[IU]/min via INTRAVENOUS

## 2015-05-22 NOTE — Progress Notes (Signed)
CVX 5/75/-2, IUPC placed Start pitocin 1/1  Mitchel HonourMegan Alyxander Kollmann, DO

## 2015-05-22 NOTE — Anesthesia Procedure Notes (Signed)
Epidural Patient location during procedure: OB Start time: 05/22/2015 11:20 AM End time: 05/22/2015 11:24 AM  Staffing Anesthesiologist: Leilani AbleHATCHETT, Elige Shouse Performed by: anesthesiologist   Preanesthetic Checklist Completed: patient identified, site marked, surgical consent, pre-op evaluation, timeout performed, IV checked, risks and benefits discussed and monitors and equipment checked  Epidural Patient position: sitting Prep: site prepped and draped and DuraPrep Patient monitoring: continuous pulse ox and blood pressure Approach: midline Location: L3-L4 Injection technique: LOR air  Needle:  Needle type: Tuohy  Needle gauge: 17 G Needle length: 9 cm and 9 Needle insertion depth: 7 cm Catheter type: closed end flexible Catheter size: 19 Gauge Catheter at skin depth: 12 cm Test dose: negative and Other  Assessment Sensory level: T9 Events: blood not aspirated, injection not painful, no injection resistance, negative IV test and no paresthesia  Additional Notes Reason for block:procedure for pain

## 2015-05-22 NOTE — MAU Note (Addendum)
Notified Dr. Langston MaskerMorris patient presents in labor, contractions every 2 to 3 minutes, cervix 3-4/80/-2, intact, fhr reactive, patient history of previous C/S desire VBAC, MD to put in admit orders to L&D, BP elevated 145/98.

## 2015-05-22 NOTE — MAU Note (Signed)
Urine in lab 

## 2015-05-22 NOTE — MAU Note (Signed)
Called Heather RN BS Charge with report received room assignment of 162.

## 2015-05-22 NOTE — Patient Instructions (Signed)
Your procedure is scheduled on:  Thursday, Dec. 1, 2016  [facility]: 6:00 AM  Pick up the phone at the desk and dial [phone].  Call this number if you have problems the morning of surgery: [phone].  Remember: Do NOT eat food or drink after:  Midnight Wednesday Take these medicines the morning of surgery with a SIP OF WATER:  None  Do NOT wear jewelry (body piercing), metal hair clips/bobby pins, or nail polish. Do NOT wear lotions, powders, or perfumes.  You may wear deoderant. Do NOT shave for 48 hours prior to surgery. Do NOT bring valuables to the hospital.  Leave suitcase in car.  After surgery it may be brought to your room.  For patients admitted to the hospital, checkout time is 11:00 AM the day of discharge.

## 2015-05-22 NOTE — Anesthesia Preprocedure Evaluation (Signed)

## 2015-05-22 NOTE — H&P (Signed)
[name] is a 34 y.o. female presenting for TOLAC.  CTX started this am and have increased in frequency.  No LOF, VB.  Active FM.  Antepartum course uncomplicated.  Previous C/S for breech.  Currently comfortable with epidural.  Maternal Medical History:  Reason for admission: Contractions.   Contractions: Onset was 6-12 hours ago.   Frequency: regular.   Perceived severity is moderate.    Fetal activity: Perceived fetal activity is normal.   Last perceived fetal movement was within the past hour.    Prenatal complications: no prenatal complications Prenatal Complications - Diabetes: none.    OB History    Gravida Para Term Preterm AB TAB SAB Ectopic Multiple Living   5 2 2  2     2      Past Medical History  Diagnosis Date  . Leg fracture, right   . IBS (irritable bowel syndrome)    Past Surgical History  Procedure Laterality Date  . No past surgeries    . Cesarean section N/A 09/11/2012    Procedure: CESAREAN SECTION;  Surgeon: Jenny AndreaJames E Tomblin II, MD;  Location: [facility];  Service: Obstetrics;  Laterality: N/A;  . Orif fibula fracture Right 11/18/2012    Procedure: OPEN REDUCTION INTERNAL FIXATION (ORIF) FIBULA FRACTURE;  Surgeon: Jenny MustardMarcus V Duda, MD;  Location: [facility];  Service: Orthopedics;  Laterality: Right;  Open Reduction Internal Fixation Right Fibula and Open Reduction Internal Fixation Right Lisfranc joint  . Gynecologic cryosurgery  2008   Family History: family history is not on file. Social History:  reports that she has never smoked. She does not have any smokeless tobacco history on file. She reports that she does not drink alcohol or use illicit drugs.   Prenatal Transfer Tool  Maternal Diabetes: No Genetic Screening: Normal Maternal Ultrasounds/Referrals: Normal Fetal Ultrasounds or other Referrals:  None Maternal Substance Abuse:  No Significant Maternal Medications:  None Significant Maternal Lab Results:  Lab values include: Group B Strep positive Other  Comments:  None  ROS  Dilation: 5 Effacement (%): 80, 70 Station: -2 Exam by:: Jenny LowAnna Potts, RN Blood pressure 123/85, pulse 99, temperature 98.8 F (37.1 C), temperature source Oral, resp. rate 16, SpO2 100 %. Maternal Exam:  Uterine Assessment: Contraction strength is moderate.  Contraction frequency is regular.   Abdomen: Patient reports no abdominal tenderness. Surgical scars: low transverse.   Fundal height is c/w dates.   Estimated fetal weight is 7#8.   Fetal presentation: vertex  Introitus: Normal vulva. Amniotic fluid character: meconium stained.  Pelvis: adequate for delivery.   Cervix: Cervix evaluated by digital exam.     Physical Exam  Constitutional: She is oriented to person, place, and time. She appears well-developed and well-nourished.  GI: Soft. There is no rebound and no guarding.  Neurological: She is alert and oriented to person, place, and time.  Skin: Skin is warm and dry.  Psychiatric: She has a normal mood and affect. Her behavior is normal.    Prenatal labs: ABO, Rh: --/--/O POS (11/28 1015) Antibody: NEG (11/28 1015) Rubella: Immune (04/28 0000) RPR: Nonreactive (04/28 0000)  HBsAg: Negative (04/28 0000)  HIV: Non-reactive (04/28 0000)  GBS:     Assessment/Plan: 76IO [id number] G5P2022 at 1963w4d for TOLAC -Add pitocin prn -Anticipate NSVD -PCN for GBS ppx    [name] 05/22/2015, 1:34 PM

## 2015-05-22 NOTE — MAU Note (Signed)
Patient presents with contractions since around 7 am, repeat C/S, denies vaginal bleeding.

## 2015-05-23 ENCOUNTER — Inpatient Hospital Stay (HOSPITAL_COMMUNITY)
Admission: RE | Admit: 2015-05-23 | Discharge: 2015-05-23 | Disposition: A | Discharge status: Home / Self Care | Payer: BC Managed Care – PPO | Source: Ambulatory Visit

## 2015-05-23 ENCOUNTER — Inpatient Hospital Stay (HOSPITAL_COMMUNITY): Payer: BC Managed Care – PPO | Admitting: Anesthesiology

## 2015-05-23 ENCOUNTER — Encounter (HOSPITAL_COMMUNITY): Payer: Self-pay

## 2015-05-23 ENCOUNTER — Encounter (HOSPITAL_COMMUNITY)
Admission: AD | Disposition: A | Discharge status: Home / Self Care | Payer: Self-pay | Source: Ambulatory Visit | Attending: Obstetrics & Gynecology

## 2015-05-23 DIAGNOSIS — O34219 Maternal care for unspecified type scar from previous cesarean delivery: Secondary | ICD-10-CM | POA: Diagnosis present

## 2015-05-23 HISTORY — DX: Irritable bowel syndrome without diarrhea: K58.9

## 2015-05-23 HISTORY — DX: Irritable bowel syndrome, unspecified: K58.9

## 2015-05-23 LAB — CBC
HCT: 33.8 % — ABNORMAL LOW (ref 36.0–46.0)
HEMOGLOBIN: 11.5 g/dL — AB (ref 12.0–15.0)
MCH: 29.3 pg (ref 26.0–34.0)
MCHC: 34 g/dL (ref 30.0–36.0)
MCV: 86 fL (ref 78.0–100.0)
PLATELETS: 217 10*3/uL (ref 150–400)
RBC: 3.93 MIL/uL (ref 3.87–5.11)
RDW: 13.9 % (ref 11.5–15.5)
WBC: 23 10*3/uL — ABNORMAL HIGH (ref 4.0–10.5)

## 2015-05-23 LAB — RPR: RPR Ser Ql: NONREACTIVE

## 2015-05-23 SURGERY — LIGATION, FALLOPIAN TUBE, BILATERAL
Anesthesia: Epidural | Laterality: Bilateral

## 2015-05-23 MED ORDER — LIDOCAINE-EPINEPHRINE (PF) 2 %-1:200000 IJ SOLN
INTRAMUSCULAR | Status: AC
  Filled 2015-05-23: qty 20

## 2015-05-23 MED ORDER — TETANUS-DIPHTH-ACELL PERTUSSIS 5-2.5-18.5 LF-MCG/0.5 IM SUSP: 0.5000 mL | Freq: Once | INTRAMUSCULAR | Status: DC

## 2015-05-23 MED ORDER — FAMOTIDINE 20 MG PO TABS
40.0000 mg | ORAL_TABLET | Freq: Once | ORAL | Status: AC
  Administered 2015-05-23: 40 mg via ORAL
  Filled 2015-05-23: qty 2

## 2015-05-23 MED ORDER — SUCROSE 24% NICU/PEDS ORAL SOLUTION
OROMUCOSAL | Status: AC
  Filled 2015-05-23: qty 0.5

## 2015-05-23 MED ORDER — METOCLOPRAMIDE HCL 10 MG PO TABS
10.0000 mg | ORAL_TABLET | Freq: Once | ORAL | Status: AC
  Administered 2015-05-23: 10 mg via ORAL
  Filled 2015-05-23: qty 1

## 2015-05-23 MED ORDER — SIMETHICONE 80 MG PO CHEW: 80.0000 mg | CHEWABLE_TABLET | ORAL | Status: DC | PRN

## 2015-05-23 MED ORDER — SODIUM BICARBONATE 8.4 % IV SOLN
INTRAVENOUS | Status: AC
  Filled 2015-05-23: qty 50

## 2015-05-23 MED ORDER — LANOLIN HYDROUS EX OINT: TOPICAL_OINTMENT | CUTANEOUS | Status: DC | PRN

## 2015-05-23 MED ORDER — DIPHENHYDRAMINE HCL 25 MG PO CAPS
25.0000 mg | ORAL_CAPSULE | Freq: Four times a day (QID) | ORAL | Status: DC | PRN
Start: 2015-05-23 — End: 2015-05-24

## 2015-05-23 MED ORDER — ACETAMINOPHEN 325 MG PO TABS: 650.0000 mg | ORAL_TABLET | ORAL | Status: DC | PRN

## 2015-05-23 MED ORDER — ONDANSETRON HCL 4 MG/2ML IJ SOLN: 4.0000 mg | INTRAMUSCULAR | Status: DC | PRN

## 2015-05-23 MED ORDER — DIBUCAINE 1 % RE OINT: 1.0000 "application " | TOPICAL_OINTMENT | RECTAL | Status: DC | PRN

## 2015-05-23 MED ORDER — GELATIN ABSORBABLE 12-7 MM EX MISC
CUTANEOUS | Status: AC
  Filled 2015-05-23: qty 1

## 2015-05-23 MED ORDER — LACTATED RINGERS IV SOLN: INTRAVENOUS | Status: DC

## 2015-05-23 MED ORDER — ZOLPIDEM TARTRATE 5 MG PO TABS: 5.0000 mg | ORAL_TABLET | Freq: Every evening | ORAL | Status: DC | PRN

## 2015-05-23 MED ORDER — PRENATAL MULTIVITAMIN CH
1.0000 | ORAL_TABLET | Freq: Every day | ORAL | Status: DC
  Administered 2015-05-23: 1 via ORAL
  Filled 2015-05-23: qty 1

## 2015-05-23 MED ORDER — OXYCODONE-ACETAMINOPHEN 5-325 MG PO TABS
1.0000 | ORAL_TABLET | ORAL | Status: DC | PRN
Start: 2015-05-23 — End: 2015-05-24
  Administered 2015-05-23: 1 via ORAL
  Filled 2015-05-23: qty 1

## 2015-05-23 MED ORDER — OXYCODONE-ACETAMINOPHEN 5-325 MG PO TABS: 2.0000 | ORAL_TABLET | ORAL | Status: DC | PRN

## 2015-05-23 MED ORDER — IBUPROFEN 600 MG PO TABS
600.0000 mg | ORAL_TABLET | Freq: Four times a day (QID) | ORAL | Status: DC
  Administered 2015-05-23 – 2015-05-24 (×5): 600 mg via ORAL
  Filled 2015-05-23 (×5): qty 1

## 2015-05-23 MED ORDER — SENNOSIDES-DOCUSATE SODIUM 8.6-50 MG PO TABS
2.0000 | ORAL_TABLET | ORAL | Status: DC
  Administered 2015-05-23 (×2): 2 via ORAL
  Filled 2015-05-23 (×2): qty 2

## 2015-05-23 MED ORDER — WITCH HAZEL-GLYCERIN EX PADS: 1.0000 "application " | MEDICATED_PAD | CUTANEOUS | Status: DC | PRN

## 2015-05-23 MED ORDER — ONDANSETRON HCL 4 MG PO TABS: 4.0000 mg | ORAL_TABLET | ORAL | Status: DC | PRN

## 2015-05-23 MED ORDER — METOCLOPRAMIDE HCL 10 MG PO TABS: 10.0000 mg | ORAL_TABLET | Freq: Once | ORAL | Status: DC

## 2015-05-23 MED ORDER — FAMOTIDINE 20 MG PO TABS: 40.0000 mg | ORAL_TABLET | Freq: Once | ORAL | Status: DC

## 2015-05-23 MED ORDER — LIDOCAINE 1%/NA BICARB 0.1 MEQ INJECTION
INJECTION | INTRAVENOUS | Status: AC
  Filled 2015-05-23: qty 1

## 2015-05-23 MED ORDER — BENZOCAINE-MENTHOL 20-0.5 % EX AERO
1.0000 "application " | INHALATION_SPRAY | CUTANEOUS | Status: DC | PRN
  Administered 2015-05-23 – 2015-05-24 (×2): 1 via TOPICAL
  Filled 2015-05-23 (×2): qty 56

## 2015-05-23 MED ORDER — ACETAMINOPHEN FOR CIRCUMCISION 160 MG/5 ML
ORAL | Status: AC
  Filled 2015-05-23: qty 1.25

## 2015-05-23 NOTE — Progress Notes (Signed)
Post Partum Day 1 Subjective: no complaints, up ad lib, voiding and + flatus  Objective: Blood pressure 133/83, pulse 85, temperature 98 F (36.7 C), temperature source Oral, resp. rate 18, height 5\' 5"  (1.651 m), weight 209 lb (94.802 kg), SpO2 100 %, unknown if currently breastfeeding.  Physical Exam:  General: alert and cooperative Lochia: appropriate Uterine Fundus: firm Incision: healing well DVT Evaluation: No evidence of DVT seen on physical exam. Negative Homan's sign. No cords or calf tenderness. Calf/Ankle edema is present.   Recent Labs  05/22/15 1015 05/23/15 0631  HGB 11.7* 11.5*  HCT 34.9* 33.8*    Assessment/Plan: Plan for discharge tomorrow, BTL scheduled for 1 pm today Patient desires circ prior to discharge   LOS: 1 day   Kenley Troop G 05/23/2015, 7:51 AM

## 2015-05-23 NOTE — Progress Notes (Signed)
Patient is interested in pp BTL. She is counseled re: risk of bleeding, infection, scarring and damage to surrounding structures.  She understands the risk of failure, regret, and ectopic.  She will be NPO after 3am with planned surgery at 1300 tomorrow with Dr. Vincente PoliGrewal.  She will keep epidural in place.  Mitchel HonourMegan Nikcole Eischeid, DO

## 2015-05-23 NOTE — Anesthesia Preprocedure Evaluation (Deleted)
Anesthesia Evaluation  Patient identified by MRN, date of birth, ID band Patient awake    Reviewed: Allergy & Precautions, NPO status , Patient's Chart, lab work & pertinent test results  History of Anesthesia Complications Negative for: history of anesthetic complications  Airway Mallampati: II  TM Distance: >3 FB Neck ROM: Full    Dental  (+) Teeth Intact, Dental Advisory Given   Pulmonary neg pulmonary ROS,    Pulmonary exam normal breath sounds clear to auscultation       Cardiovascular Exercise Tolerance: Good negative cardio ROS Normal cardiovascular exam Rhythm:Regular Rate:Normal     Neuro/Psych negative neurological ROS  negative psych ROS   GI/Hepatic negative GI ROS, Neg liver ROS,   Endo/Other  Obesity   Renal/GU negative Renal ROS     Musculoskeletal negative musculoskeletal ROS (+)   Abdominal   Peds  Hematology negative hematology ROS (+)   Anesthesia Other Findings Day of surgery medications reviewed with the patient.  Reproductive/Obstetrics Delivered at 11pm 05/22/15 Epidural in situ and at 12cm at the skin                             Anesthesia Physical Anesthesia Plan  ASA: II  Anesthesia Plan: Epidural   Post-op Pain Management:    Induction:   Airway Management Planned: Natural Airway and Nasal Cannula  Additional Equipment: None  Intra-op Plan:   Post-operative Plan:   Informed Consent: I have reviewed the patients History and Physical, chart, labs and discussed the procedure including the risks, benefits and alternatives for the proposed anesthesia with the patient or authorized representative who has indicated his/her understanding and acceptance.   Dental advisory given  Plan Discussed with: CRNA  Anesthesia Plan Comments: (Epidural in situ.  Will attempt to dose epidural prior to entrance to OR. Backup spinal anesthetic planned.  Patient in  agreement with anesthetic plan.)        Anesthesia Quick Evaluation

## 2015-05-23 NOTE — Progress Notes (Signed)
Patient has changed her mind and no longer desires BTL.  Procedure canceled by surgeon.  Jenny AranStephen Ryland Tungate, MD Anesthesiology

## 2015-05-23 NOTE — OR Nursing (Signed)
Patient has decided to cancel her surgery. Sent back to room. Jenny BashN Jenny Quadros, RN

## 2015-05-23 NOTE — Progress Notes (Signed)
  NO BTL SHE "CHANGED HER MIND"

## 2015-05-23 NOTE — Lactation Note (Signed)
This note was copied from the chart of [name]. Lactation Consultation Note  Patient Name: Jenny Dorrene Germananya Schleifer ZOXWR'UToday's Date: 05/23/2015 Reason for consult: Initial assessment Baby at 19 hr of life and is bf along with formula. Mom states that she really wants to bf but she wants FOB to be able to feed the baby too. She bf her oldest 2 wk until she "got overwhelmed". She bf her 36 yr old for 2 months until her "schedule go too crazy". Discussed feeding frequency, voids, baby belly size, supplementing, pumping, breast changes, and nipple care. She states that she can manually express and has seen colostrum. Given lactation handouts. She is aware of OP services and support group.   Maternal Data Has patient been taught Hand Expression?: Yes Does the patient have breastfeeding experience prior to this delivery?: Yes  Feeding Feeding Type: Breast Fed (encourged mother to call for assistance with latching ) Length of feed: 15 min  LATCH Score/Interventions                      Lactation Tools Discussed/Used WIC Program: Yes   Consult Status Consult Status: Follow-up Date: 05/24/15 Follow-up type: In-patient    Rulon Eisenmengerlizabeth E Master Touchet 05/23/2015, 7:08 PM

## 2015-05-23 NOTE — Anesthesia Postprocedure Evaluation (Signed)
Anesthesia Post Note  Patient: [name]  Procedure(s) Performed: * No procedures listed *  Patient location during evaluation: Mother Baby Anesthesia Type: Epidural Level of consciousness: awake and alert and oriented Pain management: pain level controlled Vital Signs Assessment: post-procedure vital signs reviewed and stable Respiratory status: spontaneous breathing Cardiovascular status: blood pressure returned to baseline and stable Postop Assessment: no headache, no backache, patient able to bend at knees and no signs of nausea or vomiting Anesthetic complications: no    Last Vitals:  Filed Vitals:   05/23/15 0230 05/23/15 0630  BP: 119/65 133/83  Pulse: 94 85  Temp: 37.1 C 36.7 C  Resp: 18 18    Last Pain:  Filed Vitals:   05/23/15 0654  PainSc: 0-No pain                 Lizandro Spellman

## 2015-05-24 MED ORDER — IBUPROFEN 600 MG PO TABS: 600.0000 mg | ORAL_TABLET | Freq: Four times a day (QID) | ORAL | Status: DC

## 2015-05-24 MED ORDER — OXYCODONE-ACETAMINOPHEN 5-325 MG PO TABS: 1.0000 | ORAL_TABLET | ORAL | Status: DC | PRN

## 2015-05-24 NOTE — Discharge Summary (Signed)
Obstetric Discharge Summary Reason for Admission: onset of labor Prenatal Procedures: ultrasound Intrapartum Procedures: spontaneous vaginal delivery Postpartum Procedures: none Complications-Operative and Postpartum: periurethral laceration HEMOGLOBIN  Date Value Ref Range Status  05/23/2015 11.5* 12.0 - 15.0 g/dL Final   HCT  Date Value Ref Range Status  05/23/2015 33.8* 36.0 - 46.0 % Final    Physical Exam:  General: alert and cooperative Lochia: appropriate Uterine Fundus: firm Incision: healing well DVT Evaluation: No evidence of DVT seen on physical exam. Negative Homan's sign. No cords or calf tenderness. No significant calf/ankle edema.  Discharge Diagnoses: Term Pregnancy-delivered  Discharge Information: Date: 05/24/2015 Activity: pelvic rest Diet: routine Medications: PNV, Ibuprofen and Percocet Condition: stable Instructions: refer to practice specific booklet Discharge to: home   Newborn Data: Live born female  Birth Weight: 7 lb 14.4 oz (3583 g) APGAR: 3, 7  Home with mother.  Koji Niehoff G 05/24/2015, 7:53 AM

## 2015-05-24 NOTE — Lactation Note (Signed)
This note was copied from the chart of [name]. Lactation Consultation Note Mom is BF then supplementing w/formula. Baby prefers Lt. Breast more than Rt. Mom is manual pumping Rt. Breast at this time. Hand expressed to check colostrum, noted thick colostrum. Baby voiding well but no stool since before birth. Baby fussy at times. Mom states baby is passing gas. Abd. Slightly distended, noted a dot of stool in diaper, working on having a stool. Wasn't even a smear. Mom stated baby wasn't burping well. Discussed different ways in burping. Explained to parents baby will have to stool before going home. Had large urine in diaper, saturated w/pink tinge. Explained that was normal at this age.  Patient Name: [name] JYNWG'NToday's Date: 05/24/2015 Reason for consult: Follow-up assessment   Maternal Data    Feeding Feeding Type: Bottle Fed - Formula Nipple Type: Slow - flow Length of feed: 25 min  LATCH Score/Interventions       Type of Nipple: Everted at rest and after stimulation Intervention(s): Hand pump  Comfort (Breast/Nipple): Soft / non-tender           Lactation Tools Discussed/Used Tools: Pump Breast pump type: Manual Pump Review: Setup, frequency, and cleaning;Milk Storage Initiated by:: RN Date initiated:: 05/23/15   Consult Status Consult Status: Follow-up Date: 05/24/15 Follow-up type: In-patient    Charyl DancerCARVER, Percival Glasheen G 05/24/2015, 5:40 AM

## 2015-05-25 ENCOUNTER — Encounter (HOSPITAL_COMMUNITY): Admission: RE | Payer: Self-pay | Source: Ambulatory Visit

## 2015-05-25 ENCOUNTER — Inpatient Hospital Stay (HOSPITAL_COMMUNITY)
Admission: RE | Admit: 2015-05-25 | Payer: BC Managed Care – PPO | Source: Ambulatory Visit | Admitting: Obstetrics and Gynecology

## 2015-05-25 SURGERY — Surgical Case
Anesthesia: Regional | Laterality: Bilateral

## 2017-03-12 LAB — OB RESULTS CONSOLE HEPATITIS B SURFACE ANTIGEN: Hepatitis B Surface Ag: NEGATIVE

## 2017-03-12 LAB — OB RESULTS CONSOLE HIV ANTIBODY (ROUTINE TESTING): HIV: NONREACTIVE

## 2017-03-12 LAB — OB RESULTS CONSOLE RPR: RPR: NONREACTIVE

## 2017-03-12 LAB — OB RESULTS CONSOLE RUBELLA ANTIBODY, IGM: Rubella: IMMUNE

## 2017-03-14 LAB — OB RESULTS CONSOLE GC/CHLAMYDIA: GC PROBE AMP, GENITAL: NEGATIVE

## 2017-06-24 NOTE — L&D Delivery Note (Signed)
Delivery Note At 2:25 PM a viable female was delivered via Vaginal, Spontaneous (Presentation: LOA;  ).  APGAR: pending, ; weight pending .   Placenta status: routine, .  Cord: 3VC with the following complications: none  Anesthesia:   Episiotomy: None Lacerations: None Suture Repair: N/A Est. Blood Loss (mL): 200   It's a boy - "[name]"! Mom to postpartum.  Baby to Couplet care / Skin to Skin.  [name] 10/05/2017, 2:43 PM

## 2017-09-16 LAB — OB RESULTS CONSOLE GBS: STREP GROUP B AG: POSITIVE

## 2017-10-04 ENCOUNTER — Inpatient Hospital Stay (HOSPITAL_COMMUNITY)
Admission: AD | Admit: 2017-10-04 | Discharge: 2017-10-07 | DRG: 807 | Disposition: A | Discharge status: Home / Self Care | Payer: BC Managed Care – PPO | Source: Ambulatory Visit | Attending: Obstetrics & Gynecology | Admitting: Obstetrics & Gynecology

## 2017-10-04 ENCOUNTER — Encounter (HOSPITAL_COMMUNITY): Payer: Self-pay | Admitting: *Deleted

## 2017-10-04 DIAGNOSIS — O34219 Maternal care for unspecified type scar from previous cesarean delivery: Secondary | ICD-10-CM | POA: Diagnosis present

## 2017-10-04 DIAGNOSIS — O99824 Streptococcus B carrier state complicating childbirth: Secondary | ICD-10-CM | POA: Diagnosis present

## 2017-10-04 DIAGNOSIS — O4292 Full-term premature rupture of membranes, unspecified as to length of time between rupture and onset of labor: Principal | ICD-10-CM | POA: Diagnosis present

## 2017-10-04 DIAGNOSIS — Z3A38 38 weeks gestation of pregnancy: Secondary | ICD-10-CM

## 2017-10-04 LAB — POCT FERN TEST: POCT Fern Test: POSITIVE

## 2017-10-04 MED ORDER — LACTATED RINGERS IV SOLN
INTRAVENOUS | Status: DC
  Administered 2017-10-05 (×2): via INTRAVENOUS

## 2017-10-04 NOTE — MAU Note (Signed)
Pt reports LOF since 2030. Denies bleeding. Pt has not felt baby move since 2030.

## 2017-10-05 ENCOUNTER — Encounter (HOSPITAL_COMMUNITY): Payer: Self-pay

## 2017-10-05 ENCOUNTER — Inpatient Hospital Stay (HOSPITAL_COMMUNITY): Payer: BC Managed Care – PPO | Admitting: Anesthesiology

## 2017-10-05 ENCOUNTER — Other Ambulatory Visit: Payer: Self-pay

## 2017-10-05 DIAGNOSIS — O99824 Streptococcus B carrier state complicating childbirth: Secondary | ICD-10-CM | POA: Diagnosis present

## 2017-10-05 DIAGNOSIS — O34219 Maternal care for unspecified type scar from previous cesarean delivery: Secondary | ICD-10-CM | POA: Diagnosis present

## 2017-10-05 DIAGNOSIS — O4292 Full-term premature rupture of membranes, unspecified as to length of time between rupture and onset of labor: Secondary | ICD-10-CM | POA: Diagnosis present

## 2017-10-05 DIAGNOSIS — Z3A38 38 weeks gestation of pregnancy: Secondary | ICD-10-CM | POA: Diagnosis not present

## 2017-10-05 LAB — CBC
HEMATOCRIT: 35 % — AB (ref 36.0–46.0)
HEMOGLOBIN: 11.5 g/dL — AB (ref 12.0–15.0)
MCH: 28.6 pg (ref 26.0–34.0)
MCHC: 32.9 g/dL (ref 30.0–36.0)
MCV: 87.1 fL (ref 78.0–100.0)
Platelets: 246 10*3/uL (ref 150–400)
RBC: 4.02 MIL/uL (ref 3.87–5.11)
RDW: 13.2 % (ref 11.5–15.5)
WBC: 8.1 10*3/uL (ref 4.0–10.5)

## 2017-10-05 LAB — TYPE AND SCREEN
ABO/RH(D): O POS
ANTIBODY SCREEN: NEGATIVE

## 2017-10-05 LAB — RPR: RPR: NONREACTIVE

## 2017-10-05 MED ORDER — EPHEDRINE 5 MG/ML INJ
10.0000 mg | INTRAVENOUS | Status: DC | PRN
  Filled 2017-10-05: qty 2
  Filled 2017-10-05: qty 4

## 2017-10-05 MED ORDER — FLEET ENEMA 7-19 GM/118ML RE ENEM: 1.0000 | ENEMA | RECTAL | Status: DC | PRN

## 2017-10-05 MED ORDER — OXYTOCIN BOLUS FROM INFUSION
500.0000 mL | Freq: Once | INTRAVENOUS | Status: AC
  Administered 2017-10-05: 500 mL via INTRAVENOUS

## 2017-10-05 MED ORDER — ONDANSETRON HCL 4 MG/2ML IJ SOLN: 4.0000 mg | Freq: Four times a day (QID) | INTRAMUSCULAR | Status: DC | PRN

## 2017-10-05 MED ORDER — SIMETHICONE 80 MG PO CHEW: 80.0000 mg | CHEWABLE_TABLET | ORAL | Status: DC | PRN

## 2017-10-05 MED ORDER — EPHEDRINE 5 MG/ML INJ: 10.0000 mg | INTRAVENOUS | Status: DC | PRN

## 2017-10-05 MED ORDER — OXYTOCIN 40 UNITS IN LACTATED RINGERS INFUSION - SIMPLE MED
1.0000 m[IU]/min | INTRAVENOUS | Status: DC
  Administered 2017-10-05: 10 m[IU]/min via INTRAVENOUS
  Administered 2017-10-05: 2 m[IU]/min via INTRAVENOUS
  Administered 2017-10-05: 4 m[IU]/min via INTRAVENOUS
  Administered 2017-10-05: 12 m[IU]/min via INTRAVENOUS
  Filled 2017-10-05: qty 1000

## 2017-10-05 MED ORDER — LACTATED RINGERS IV SOLN: 500.0000 mL | Freq: Once | INTRAVENOUS | Status: DC

## 2017-10-05 MED ORDER — SENNOSIDES-DOCUSATE SODIUM 8.6-50 MG PO TABS
2.0000 | ORAL_TABLET | ORAL | Status: DC
  Administered 2017-10-05 – 2017-10-06 (×2): 2 via ORAL
  Filled 2017-10-05 (×2): qty 2

## 2017-10-05 MED ORDER — OXYTOCIN 40 UNITS IN LACTATED RINGERS INFUSION - SIMPLE MED: 2.5000 [IU]/h | INTRAVENOUS | Status: DC

## 2017-10-05 MED ORDER — ACETAMINOPHEN 325 MG PO TABS: 650.0000 mg | ORAL_TABLET | ORAL | Status: DC | PRN

## 2017-10-05 MED ORDER — WITCH HAZEL-GLYCERIN EX PADS: 1.0000 "application " | MEDICATED_PAD | CUTANEOUS | Status: DC | PRN

## 2017-10-05 MED ORDER — TERBUTALINE SULFATE 1 MG/ML IJ SOLN
0.2500 mg | Freq: Once | INTRAMUSCULAR | Status: DC | PRN
  Filled 2017-10-05: qty 1

## 2017-10-05 MED ORDER — LIDOCAINE HCL (PF) 1 % IJ SOLN
30.0000 mL | INTRAMUSCULAR | Status: DC | PRN
  Filled 2017-10-05: qty 30

## 2017-10-05 MED ORDER — SODIUM CHLORIDE 0.9 % IV SOLN
5.0000 10*6.[IU] | Freq: Once | INTRAVENOUS | Status: AC
  Administered 2017-10-05: 5 10*6.[IU] via INTRAVENOUS
  Filled 2017-10-05: qty 5

## 2017-10-05 MED ORDER — LIDOCAINE HCL (PF) 1 % IJ SOLN
INTRAMUSCULAR | Status: DC | PRN
  Administered 2017-10-05: 4 mL via EPIDURAL

## 2017-10-05 MED ORDER — ONDANSETRON HCL 4 MG/2ML IJ SOLN: 4.0000 mg | INTRAMUSCULAR | Status: DC | PRN

## 2017-10-05 MED ORDER — PHENYLEPHRINE 40 MCG/ML (10ML) SYRINGE FOR IV PUSH (FOR BLOOD PRESSURE SUPPORT)
80.0000 ug | PREFILLED_SYRINGE | INTRAVENOUS | Status: DC | PRN
  Filled 2017-10-05: qty 5

## 2017-10-05 MED ORDER — ZOLPIDEM TARTRATE 5 MG PO TABS: 5.0000 mg | ORAL_TABLET | Freq: Every evening | ORAL | Status: DC | PRN

## 2017-10-05 MED ORDER — OXYCODONE-ACETAMINOPHEN 5-325 MG PO TABS: 2.0000 | ORAL_TABLET | ORAL | Status: DC | PRN

## 2017-10-05 MED ORDER — PENICILLIN G POT IN DEXTROSE 60000 UNIT/ML IV SOLN
3.0000 10*6.[IU] | INTRAVENOUS | Status: DC
  Administered 2017-10-05 (×3): 3 10*6.[IU] via INTRAVENOUS
  Filled 2017-10-05 (×8): qty 50

## 2017-10-05 MED ORDER — DIPHENHYDRAMINE HCL 25 MG PO CAPS: 25.0000 mg | ORAL_CAPSULE | Freq: Four times a day (QID) | ORAL | Status: DC | PRN

## 2017-10-05 MED ORDER — BENZOCAINE-MENTHOL 20-0.5 % EX AERO: 1.0000 "application " | INHALATION_SPRAY | CUTANEOUS | Status: DC | PRN

## 2017-10-05 MED ORDER — EPHEDRINE 5 MG/ML INJ
10.0000 mg | INTRAVENOUS | Status: DC | PRN
  Administered 2017-10-05: 10 mg via INTRAVENOUS
  Filled 2017-10-05: qty 2

## 2017-10-05 MED ORDER — PHENYLEPHRINE 40 MCG/ML (10ML) SYRINGE FOR IV PUSH (FOR BLOOD PRESSURE SUPPORT)
80.0000 ug | PREFILLED_SYRINGE | INTRAVENOUS | Status: DC | PRN
  Administered 2017-10-05 (×2): 80 ug via INTRAVENOUS
  Filled 2017-10-05: qty 10
  Filled 2017-10-05: qty 5

## 2017-10-05 MED ORDER — FENTANYL 2.5 MCG/ML BUPIVACAINE 1/10 % EPIDURAL INFUSION (WH - ANES)
14.0000 mL/h | INTRAMUSCULAR | Status: DC | PRN
  Administered 2017-10-05 (×2): 14 mL/h via EPIDURAL
  Filled 2017-10-05 (×2): qty 100

## 2017-10-05 MED ORDER — IBUPROFEN 600 MG PO TABS
600.0000 mg | ORAL_TABLET | Freq: Four times a day (QID) | ORAL | Status: DC
  Administered 2017-10-05 – 2017-10-07 (×8): 600 mg via ORAL
  Filled 2017-10-05 (×8): qty 1

## 2017-10-05 MED ORDER — ACETAMINOPHEN 325 MG PO TABS
650.0000 mg | ORAL_TABLET | ORAL | Status: DC | PRN
  Administered 2017-10-06 – 2017-10-07 (×4): 650 mg via ORAL
  Filled 2017-10-05 (×4): qty 2

## 2017-10-05 MED ORDER — DIPHENHYDRAMINE HCL 50 MG/ML IJ SOLN: 12.5000 mg | INTRAMUSCULAR | Status: DC | PRN

## 2017-10-05 MED ORDER — TETANUS-DIPHTH-ACELL PERTUSSIS 5-2.5-18.5 LF-MCG/0.5 IM SUSP: 0.5000 mL | Freq: Once | INTRAMUSCULAR | Status: DC

## 2017-10-05 MED ORDER — OXYCODONE HCL 5 MG PO TABS
5.0000 mg | ORAL_TABLET | ORAL | Status: DC | PRN
  Administered 2017-10-06 (×3): 5 mg via ORAL
  Filled 2017-10-05 (×2): qty 1

## 2017-10-05 MED ORDER — OXYCODONE HCL 5 MG PO TABS
10.0000 mg | ORAL_TABLET | ORAL | Status: DC | PRN
  Filled 2017-10-05: qty 2

## 2017-10-05 MED ORDER — DIBUCAINE 1 % RE OINT
1.0000 | TOPICAL_OINTMENT | RECTAL | Status: DC | PRN
Start: 2017-10-05 — End: 2017-10-07

## 2017-10-05 MED ORDER — COCONUT OIL OIL: 1.0000 "application " | TOPICAL_OIL | Status: DC | PRN

## 2017-10-05 MED ORDER — PRENATAL MULTIVITAMIN CH
1.0000 | ORAL_TABLET | Freq: Every day | ORAL | Status: DC
  Administered 2017-10-06 – 2017-10-07 (×2): 1 via ORAL
  Filled 2017-10-05 (×2): qty 1

## 2017-10-05 MED ORDER — ONDANSETRON HCL 4 MG PO TABS: 4.0000 mg | ORAL_TABLET | ORAL | Status: DC | PRN

## 2017-10-05 MED ORDER — SOD CITRATE-CITRIC ACID 500-334 MG/5ML PO SOLN
30.0000 mL | ORAL | Status: DC | PRN
  Administered 2017-10-05: 30 mL via ORAL
  Filled 2017-10-05: qty 15

## 2017-10-05 MED ORDER — LACTATED RINGERS IV SOLN: 500.0000 mL | INTRAVENOUS | Status: DC | PRN

## 2017-10-05 MED ORDER — PHENYLEPHRINE 40 MCG/ML (10ML) SYRINGE FOR IV PUSH (FOR BLOOD PRESSURE SUPPORT)
80.0000 ug | PREFILLED_SYRINGE | INTRAVENOUS | Status: DC | PRN
  Administered 2017-10-05: 80 ug via INTRAVENOUS

## 2017-10-05 MED ORDER — OXYCODONE-ACETAMINOPHEN 5-325 MG PO TABS: 1.0000 | ORAL_TABLET | ORAL | Status: DC | PRN

## 2017-10-05 NOTE — Anesthesia Preprocedure Evaluation (Signed)
Anesthesia Evaluation  Patient identified by MRN, date of birth, ID band Patient awake    Reviewed: Allergy & Precautions, Patient's Chart, lab work & pertinent test results  Airway Mallampati: II       Dental no notable dental hx.    Pulmonary    Pulmonary exam normal        Cardiovascular Normal cardiovascular exam     Neuro/Psych negative neurological ROS  negative psych ROS   GI/Hepatic Neg liver ROS,   Endo/Other    Renal/GU      Musculoskeletal   Abdominal   Peds  Hematology negative hematology ROS (+)   Anesthesia Other Findings   Reproductive/Obstetrics (+) Pregnancy                             Anesthesia Physical Anesthesia Plan  ASA: II  Anesthesia Plan: Epidural   Post-op Pain Management:    Induction:   PONV Risk Score and Plan:   Airway Management Planned:   Additional Equipment:   Intra-op Plan:   Post-operative Plan:   Informed Consent: I have reviewed the patients History and Physical, chart, labs and discussed the procedure including the risks, benefits and alternatives for the proposed anesthesia with the patient or authorized representative who has indicated his/her understanding and acceptance.     Plan Discussed with:   Anesthesia Plan Comments: (Lab Results      Component                Value               Date                      WBC                      8.1                 10/05/2017                HGB                      11.5 (L)            10/05/2017                HCT                      35.0 (L)            10/05/2017                MCV                      87.1                10/05/2017                PLT                      246                 10/05/2017           )        Anesthesia Quick Evaluation

## 2017-10-05 NOTE — Anesthesia Procedure Notes (Signed)
Epidural Patient location during procedure: OB Start time: 10/05/2017 4:12 AM End time: 10/05/2017 4:18 AM  Staffing Anesthesiologist: Shelton SilvasHollis, Lateisha Thurlow D, MD Performed: anesthesiologist   Preanesthetic Checklist Completed: patient identified, site marked, surgical consent, pre-op evaluation, timeout performed, IV checked, risks and benefits discussed and monitors and equipment checked  Epidural Patient position: sitting Prep: ChloraPrep Patient monitoring: heart rate, continuous pulse ox and blood pressure Approach: midline Location: L3-L4 Injection technique: LOR saline  Needle:  Needle type: Tuohy  Needle gauge: 17 G Needle length: 9 cm Catheter type: closed end flexible Catheter size: 20 Guage Test dose: negative and 1.5% lidocaine  Assessment Events: blood not aspirated, injection not painful, no injection resistance and no paresthesia  Additional Notes LOR @ 5  Patient identified. Risks/Benefits/Options discussed with patient including but not limited to bleeding, infection, nerve damage, paralysis, failed block, incomplete pain control, headache, blood pressure changes, nausea, vomiting, reactions to medications, itching and postpartum back pain. Confirmed with bedside nurse the patient's most recent platelet count. Confirmed with patient that they are not currently taking any anticoagulation, have any bleeding history or any family history of bleeding disorders. Patient expressed understanding and wished to proceed. All questions were answered. Sterile technique was used throughout the entire procedure. Please see nursing notes for vital signs. Test dose was given through epidural catheter and negative prior to continuing to dose epidural or start infusion. Warning signs of high block given to the patient including shortness of breath, tingling/numbness in hands, complete motor block, or any concerning symptoms with instructions to call for help. Patient was given instructions on  fall risk and not to get out of bed. All questions and concerns addressed with instructions to call with any issues or inadequate analgesia.    Reason for block:procedure for pain

## 2017-10-05 NOTE — Progress Notes (Signed)
Comf w/cle SVE 5.5/80/-2 IUPC in place w/out adequate ctxn FHT overall reassuring - early decels, good var Cont pitocin GBS pos - cont PCN   Rosie FateE Reonna Finlayson MD

## 2017-10-05 NOTE — H&P (Signed)
Dorrene Germananya Camper is a 37 y.o. female presenting for PROM. OB History    Gravida  6   Para  3   Term  3   Preterm      AB  2   Living  3     SAB      TAB      Ectopic      Multiple  0   Live Births  3          Past Medical History:  Diagnosis Date  . IBS (irritable bowel syndrome)   . Leg fracture, right    Past Surgical History:  Procedure Laterality Date  . CESAREAN SECTION N/A 09/11/2012   Procedure: CESAREAN SECTION;  Surgeon: Leslie AndreaJames E Tomblin II, MD;  Location: [facility];  Service: Obstetrics;  Laterality: N/A;  . GYNECOLOGIC CRYOSURGERY  2008  . NO PAST SURGERIES    . ORIF FIBULA FRACTURE Right 11/18/2012   Procedure: OPEN REDUCTION INTERNAL FIXATION (ORIF) FIBULA FRACTURE;  Surgeon: Nadara MustardMarcus V Duda, MD;  Location: [facility];  Service: Orthopedics;  Laterality: Right;  Open Reduction Internal Fixation Right Fibula and Open Reduction Internal Fixation Right Lisfranc joint   Family History: family history is not on file. Social History:  reports that she has never smoked. She has never used smokeless tobacco. She reports that she does not drink alcohol or use drugs.     Maternal Diabetes: No Genetic Screening: Normal Maternal Ultrasounds/Referrals: Normal Fetal Ultrasounds or other Referrals:  None Maternal Substance Abuse:  No Significant Maternal Medications:  None Significant Maternal Lab Results:  None Other Comments:  None  ROS History Dilation: 4 Effacement (%): Thick Station: -2 Exam by:: Dr Elon SpannerLeger Blood pressure 113/69, pulse 93, temperature 98.3 F (36.8 C), temperature source Oral, resp. rate 16, height 5\' 4"  (1.626 m), weight 92.5 kg (204 lb), SpO2 100 %, unknown if currently breastfeeding. Exam Physical Exam  Prenatal labs: ABO, Rh: --/--/O POS (04/14 0026) Antibody: NEG (04/14 0026) Rubella:   RPR:    HBsAg:    HIV:    GBS:     Assessment/Plan: 36yo [id number] @ 38.3 wga presenting s/p PROM. Counseled regarding h/o prior CS for breech and  desires VTOLAC over CS - risks including failure with need for CS and uterine rupture of 1.5% and if that were to occur ~10% risk neonatal morbidity/mortality. Pt consents to proceed.  Pitocin started as patient was not contracting, now 4cm. FHT cat 1.  GBS pos - PCN per protocol.     Ranae Pilalise Jennifer Leger 10/05/2017, 7:46 AM

## 2017-10-05 NOTE — Plan of Care (Signed)
Patient is resting in bed at this time. Denies any pain at this time. Patient has voided without any difficulty. Ambulated from bed to bathroom without any difficulty. Light bleeding noted on peripad.

## 2017-10-05 NOTE — Anesthesia Pain Management Evaluation Note (Signed)
  [facility] Pain Management Visit Note  Patient: [name], 37 y.o., female  "Hello I am a member of the anesthesia team at [facility]. We have an anesthesia team available at all times to provide care throughout the hospital, including epidural management and anesthesia for C-section. I don't know your plan for the delivery whether it a natural birth, water birth, IV sedation, nitrous supplementation, doula or epidural, but we want to meet your pain goals."   1.Was your pain managed to your expectations on prior hospitalizations?   Yes   2.What is your expectation for pain management during this hospitalization?     Epidural  3.How can we help you reach that goal? epidural  Record the patient's initial score and the patient's pain goal.   Pain: 0  Pain Goal: 4 [facility] wants you to be able to say your pain was always managed very well.  Yurika Pereda 10/05/2017

## 2017-10-06 LAB — CBC
HCT: 32.1 % — ABNORMAL LOW (ref 36.0–46.0)
Hemoglobin: 10.9 g/dL — ABNORMAL LOW (ref 12.0–15.0)
MCH: 29.1 pg (ref 26.0–34.0)
MCHC: 34 g/dL (ref 30.0–36.0)
MCV: 85.8 fL (ref 78.0–100.0)
Platelets: 216 10*3/uL (ref 150–400)
RBC: 3.74 MIL/uL — ABNORMAL LOW (ref 3.87–5.11)
RDW: 13.4 % (ref 11.5–15.5)
WBC: 12.2 10*3/uL — ABNORMAL HIGH (ref 4.0–10.5)

## 2017-10-06 NOTE — Progress Notes (Signed)
Post Partum Day 1 Subjective: no complaints, up ad lib, voiding and tolerating PO.  Patient desires circ but requested to defer until tomorrow given elevated bili and starting phototherapy this morning.  Objective: Blood pressure 114/66, pulse 69, temperature 98.4 F (36.9 C), temperature source Oral, resp. rate 16, height 5\' 4"  (1.626 m), weight 204 lb (92.5 kg), SpO2 99 %, unknown if currently breastfeeding.  Physical Exam:  General: alert, cooperative and appears stated age Lochia: appropriate Uterine Fundus: firm Incision: healing well, no significant drainage, no dehiscence DVT Evaluation: No evidence of DVT seen on physical exam. Negative Homan's sign. No cords or calf tenderness.  Recent Labs    10/05/17 0026 10/06/17 0540  HGB 11.5* 10.9*  HCT 35.0* 32.1*    Assessment/Plan: Plan for discharge tomorrow and Circumcision prior to discharge   LOS: 1 day   Tahara Ruffini 10/06/2017, 9:46 AM

## 2017-10-06 NOTE — Anesthesia Postprocedure Evaluation (Signed)
Anesthesia Post Note  Patient: [name]  Procedure(s) Performed: AN AD HOC LABOR EPIDURAL     Patient location during evaluation: Mother Baby Anesthesia Type: Epidural Level of consciousness: awake and alert Pain management: pain level controlled Vital Signs Assessment: post-procedure vital signs reviewed and stable Respiratory status: spontaneous breathing, nonlabored ventilation and respiratory function stable Cardiovascular status: stable Postop Assessment: no headache, no backache, epidural receding, adequate PO intake, no apparent nausea or vomiting and patient able to bend at knees Anesthetic complications: no    Last Vitals:  Vitals:   10/05/17 2200 10/06/17 0543  BP: 120/60 114/66  Pulse: 86 69  Resp: 18 16  Temp: 36.7 C 36.9 C  SpO2:      Last Pain:  Vitals:   10/06/17 0820  TempSrc:   PainSc: 6    Pain Goal: Patients Stated Pain Goal: 5 (10/05/17 0355)               Laban EmperorMalinova,Ludwig Tugwell Hristova

## 2017-10-07 LAB — BIRTH TISSUE RECOVERY COLLECTION (PLACENTA DONATION)

## 2017-10-07 NOTE — Discharge Summary (Signed)
Obstetric Discharge Summary Reason for Admission: rupture of membranes Prenatal Procedures: none Intrapartum Procedures: spontaneous vaginal delivery Postpartum Procedures: none Complications-Operative and Postpartum: none Hemoglobin  Date Value Ref Range Status  10/06/2017 10.9 (L) 12.0 - 15.0 g/dL Final   HCT  Date Value Ref Range Status  10/06/2017 32.1 (L) 36.0 - 46.0 % Final    Physical Exam:  General: alert, cooperative and appears stated age 29Lochia: appropriate Uterine Fundus: firm Incision: healing well, no significant drainage, no dehiscence DVT Evaluation: No evidence of DVT seen on physical exam.  Discharge Diagnoses: Term Pregnancy-delivered  Discharge Information: Date: 10/07/2017 Activity: pelvic rest Diet: routine Medications: None Condition: improved Instructions: refer to practice specific booklet Discharge to: home   Newborn Data: Live born female  Birth Weight: 6 lb 10.9 oz (3030 g) APGAR: 8, 9  Newborn Delivery   Birth date/time:  10/05/2017 14:25:00 Delivery type:  Vaginal, Spontaneous     Home with mother.  [name] L 10/07/2017, 9:52 AM

## 2017-12-04 NOTE — H&P (Signed)
37 year old G 6 P 4 presents for BTL. Desires permanent sterilization.  Past Medical History:  Diagnosis Date  . IBS (irritable bowel syndrome)   . Leg fracture, right    Past Surgical History:  Procedure Laterality Date  . CESAREAN SECTION N/A 09/11/2012   Procedure: CESAREAN SECTION;  Surgeon: Leslie AndreaJames E Tomblin II, MD;  Location: [facility];  Service: Obstetrics;  Laterality: N/A;  . GYNECOLOGIC CRYOSURGERY  2008  . NO PAST SURGERIES    . ORIF FIBULA FRACTURE Right 11/18/2012   Procedure: OPEN REDUCTION INTERNAL FIXATION (ORIF) FIBULA FRACTURE;  Surgeon: Nadara MustardMarcus V Duda, MD;  Location: [facility];  Service: Orthopedics;  Laterality: Right;  Open Reduction Internal Fixation Right Fibula and Open Reduction Internal Fixation Right Lisfranc joint   Patient has no known allergies.  Prior to Admission medications   Medication Sig Start Date End Date Taking? Authorizing Provider  acetaminophen (TYLENOL) 650 MG CR tablet Take 1,300 mg by mouth every 8 (eight) hours as needed for pain. MUSCLE ACHES AND PAIN.   Yes [provider]  Multiple Vitamin (MULTIVITAMIN WITH MINERALS) TABS tablet Take 1 tablet by mouth every evening.   Yes [provider]  Probiotic Product (PROBIOTIC PO) Take 1 capsule by mouth every evening.   Yes [provider]   General alert and oriented Lung CTAB Car RRR Pelvic WNL  IMPRESSION: Desires Permanent Sterilization  PLAN: LSC BTL Risks reviewed Consent signed

## 2017-12-08 ENCOUNTER — Encounter (HOSPITAL_COMMUNITY)
Admission: RE | Admit: 2017-12-08 | Discharge: 2017-12-08 | Disposition: A | Discharge status: Home / Self Care | Payer: BC Managed Care – PPO | Source: Ambulatory Visit | Attending: Obstetrics and Gynecology | Admitting: Obstetrics and Gynecology

## 2017-12-22 ENCOUNTER — Ambulatory Visit (HOSPITAL_COMMUNITY)
Admission: AD | Admit: 2017-12-22 | Payer: BC Managed Care – PPO | Source: Ambulatory Visit | Admitting: Obstetrics and Gynecology

## 2017-12-22 SURGERY — LIGATION, FALLOPIAN TUBE, LAPAROSCOPIC
Anesthesia: General | Laterality: Bilateral

## 2018-06-04 ENCOUNTER — Encounter: Payer: Self-pay | Admitting: Internal Medicine

## 2018-06-10 ENCOUNTER — Telehealth: Payer: Self-pay | Admitting: Internal Medicine

## 2018-06-10 NOTE — Telephone Encounter (Signed)
Called patient to inform on how to become a new patient.

## 2018-07-13 ENCOUNTER — Ambulatory Visit: Payer: BC Managed Care – PPO | Admitting: Internal Medicine

## 2019-08-28 ENCOUNTER — Ambulatory Visit: Payer: BC Managed Care – PPO | Attending: Internal Medicine

## 2019-08-28 DIAGNOSIS — Z23 Encounter for immunization: Secondary | ICD-10-CM | POA: Insufficient documentation

## 2019-08-28 NOTE — Progress Notes (Signed)
   Covid-19 Vaccination Clinic  Name:  Eryn Marandola    MRN: [id number] DOB: [birth year]  08/28/2019  Ms. Dossantos was observed post Covid-19 immunization for 15 minutes without incident. She was provided with Vaccine Information Sheet and instruction to access the V-Safe system.   Ms. Scoles was instructed to call 911 with any severe reactions post vaccine: Marland Kitchen Difficulty breathing  . Swelling of face and throat  . A fast heartbeat  . A bad rash all over body  . Dizziness and weakness   Immunizations Administered    Name Date Dose VIS Date Route   Pfizer COVID-19 Vaccine 08/28/2019 10:58 AM 0.3 mL 06/04/2019 Intramuscular   Manufacturer: [employer], [employer]   Lot: [id number]   NDC: [address]-5

## 2019-09-18 ENCOUNTER — Ambulatory Visit: Payer: BC Managed Care – PPO | Attending: Internal Medicine

## 2019-09-18 DIAGNOSIS — Z23 Encounter for immunization: Secondary | ICD-10-CM

## 2019-09-18 NOTE — Progress Notes (Signed)
   Covid-19 Vaccination Clinic  Name:  [name]    MRN: [id number] DOB: 1981/04/19  09/18/2019  [name] was observed post Covid-19 immunization for 15 minutes without incident. She was provided with Vaccine Information Sheet and instruction to access the V-Safe system.   [name] was instructed to call 911 with any severe reactions post vaccine: Marland Kitchen Difficulty breathing  . Swelling of face and throat  . A fast heartbeat  . A bad rash all over body  . Dizziness and weakness   Immunizations Administered    Name Date Dose VIS Date Route   Pfizer COVID-19 Vaccine 09/18/2019  9:18 AM 0.3 mL 06/04/2019 Intramuscular   Manufacturer: [employer], [employer]   Lot: [id number]   NDC: [address]-7

## 2019-12-13 ENCOUNTER — Other Ambulatory Visit: Payer: Self-pay | Admitting: Obstetrics and Gynecology

## 2019-12-13 DIAGNOSIS — R928 Other abnormal and inconclusive findings on diagnostic imaging of breast: Secondary | ICD-10-CM

## 2019-12-23 ENCOUNTER — Other Ambulatory Visit: Payer: Self-pay

## 2019-12-23 ENCOUNTER — Ambulatory Visit
Admission: RE | Admit: 2019-12-23 | Discharge: 2019-12-23 | Disposition: A | Discharge status: Home / Self Care | Payer: BC Managed Care – PPO | Source: Ambulatory Visit | Attending: Obstetrics and Gynecology | Admitting: Obstetrics and Gynecology

## 2019-12-23 ENCOUNTER — Other Ambulatory Visit: Payer: Self-pay | Admitting: Obstetrics and Gynecology

## 2019-12-23 DIAGNOSIS — R928 Other abnormal and inconclusive findings on diagnostic imaging of breast: Secondary | ICD-10-CM

## 2019-12-31 ENCOUNTER — Ambulatory Visit
Admission: RE | Admit: 2019-12-31 | Discharge: 2019-12-31 | Disposition: A | Discharge status: Home / Self Care | Payer: BC Managed Care – PPO | Source: Ambulatory Visit | Attending: Obstetrics and Gynecology | Admitting: Obstetrics and Gynecology

## 2019-12-31 ENCOUNTER — Other Ambulatory Visit: Payer: Self-pay

## 2019-12-31 DIAGNOSIS — R928 Other abnormal and inconclusive findings on diagnostic imaging of breast: Secondary | ICD-10-CM

## 2020-10-16 IMAGING — US US BREAST*R* LIMITED INC AXILLA
1 series · 10 of 10 positions shown · non-contrast
Comparison: 12/08/2019

CLINICAL DATA: Returns after baseline screening for evaluation of a
possible RIGHT mass.

EXAM:
DIGITAL DIAGNOSTIC RIGHT MAMMOGRAM WITH CAD AND TOMO
ULTRASOUND RIGHT BREAST

[Series 1: us breast*right* limited inc axilla · 0.06mm/px · 10 of 10 slices shown]
[im 1/10]
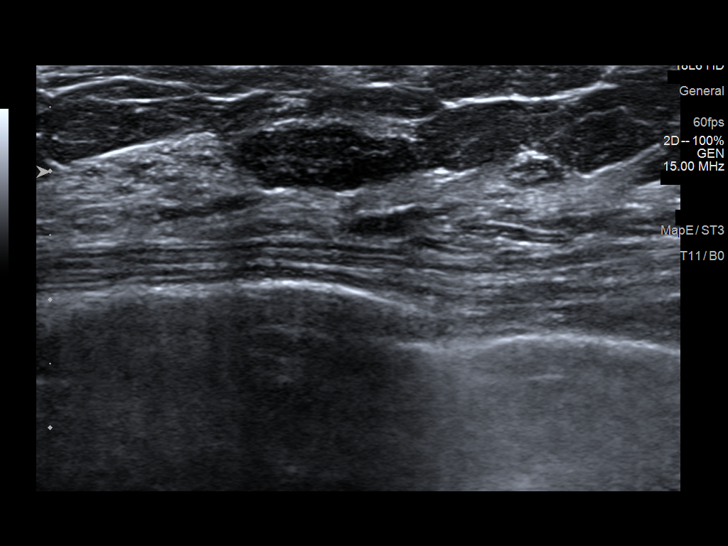
[im 2/10]
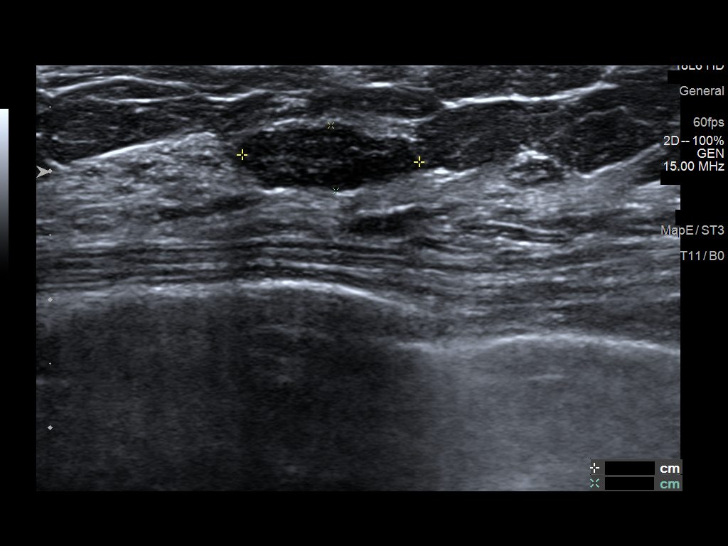
[im 3/10]
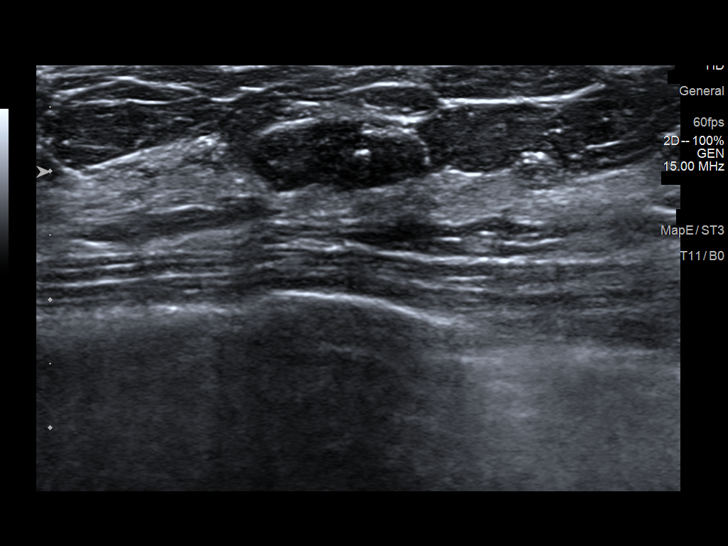
[im 4/10]
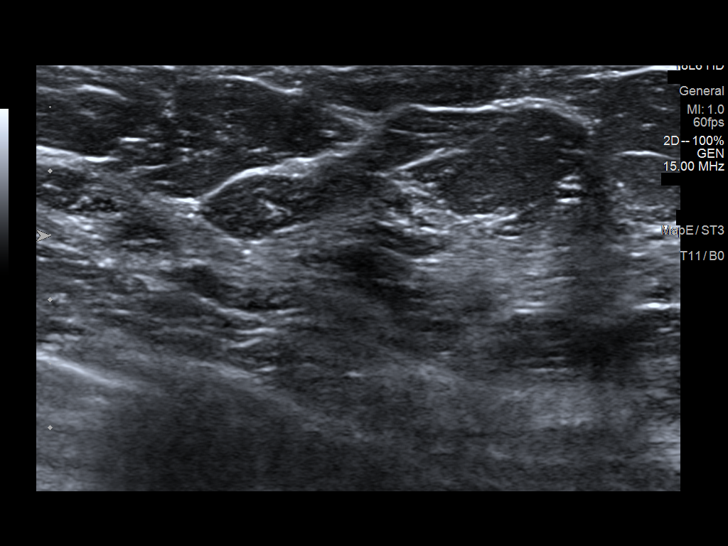
[im 5/10]
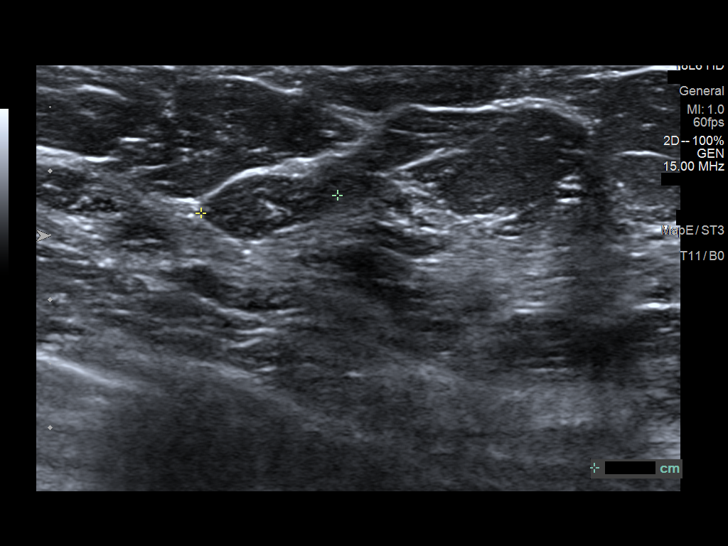
[im 6/10]
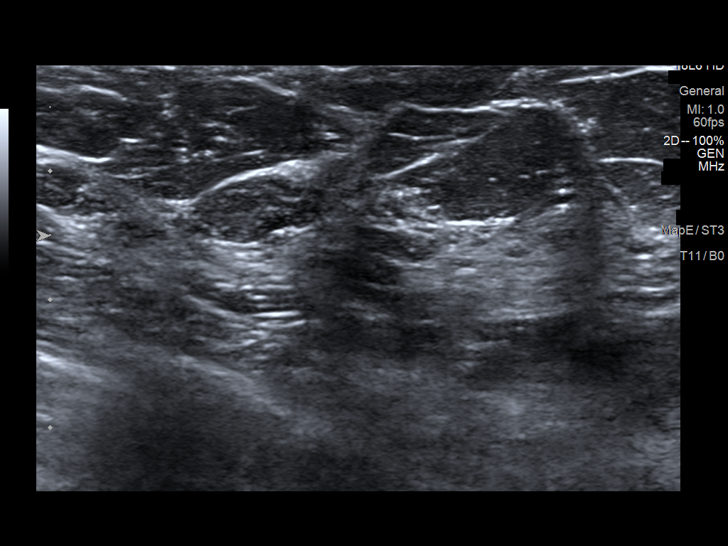
[im 7/10]
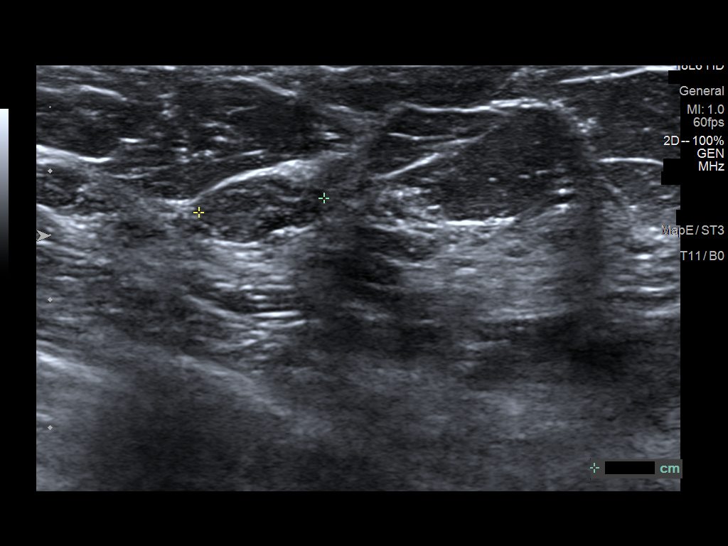
[im 8/10]
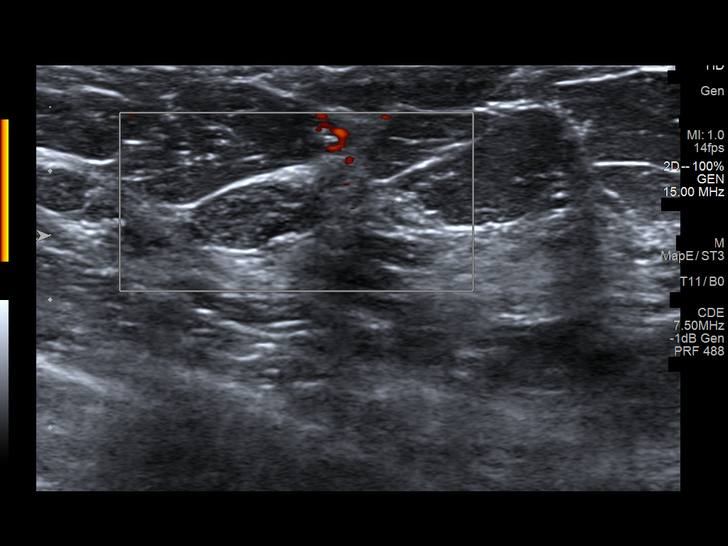
[im 9/10]
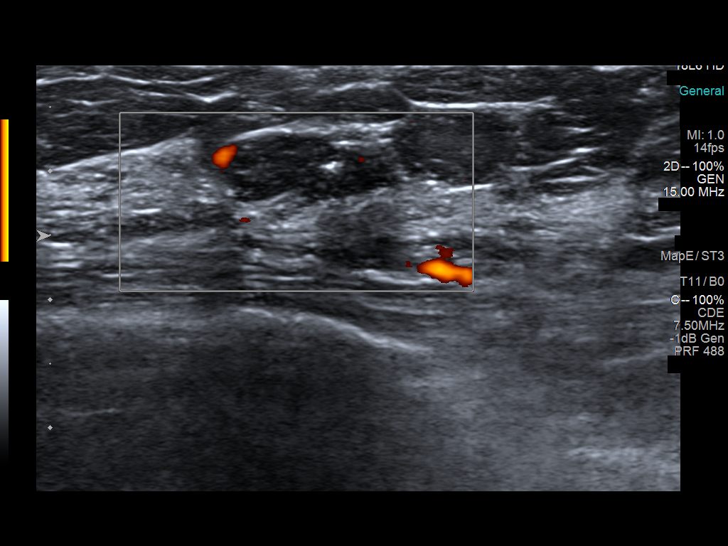
[im 10/10]
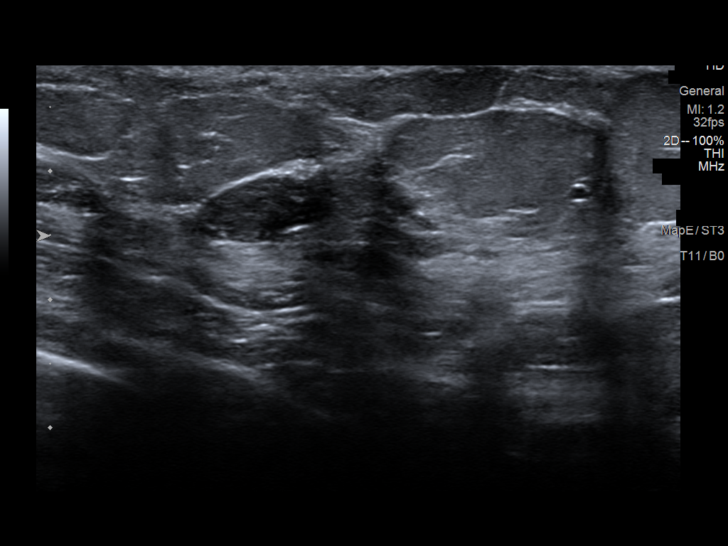

[10 of 10 positions shown; findings below may reference images not displayed]

ACR Breast Density Category c: The breast tissue is heterogeneously
dense, which may obscure small masses.
FINDINGS: Additional 2-D and 3-D images are performed. These views confirm
presence of a partially obscured mass associated with single coarse,
dystrophic calcification in the LOWER OUTER QUADRANT of the RIGHT
breast.

Mammographic images were processed with CAD.

Targeted ultrasound is performed, showing a circumscribed oval
hypoechoic parallel mass in the 8 o'clock location RIGHT breast 3
centimeters from the nipple measuring 1.4 x 0.5 x 1.1 centimeters.
Internal calcification is confirmed sonographically. Appearance is
consistent with benign fibroadenoma.
IMPRESSION: Probable fibroadenoma in the RIGHT breast. We discussed management
options including excision, biopsy, and close follow-up. Patient
elects ultrasound-guided core biopsy for definitive diagnosis.

RECOMMENDATION:
Ultrasound-guided core biopsy of RIGHT breast mass.

I have discussed the findings and recommendations with the patient.
If applicable, a reminder letter will be sent to the patient
regarding the next appointment.

BI-RADS CATEGORY  3: Probably benign.

## 2020-10-24 IMAGING — MG MM BREAST LOCALIZATION CLIP
4 series · 4 of 12 positions shown · non-contrast
Comparison: Previous exam(s).

CLINICAL DATA: Status post ultrasound-guided biopsy of a RIGHT
breast mass.

EXAM:
DIAGNOSTIC RIGHT MAMMOGRAM POST ULTRASOUND BIOPSY

[R ML synth-2D]
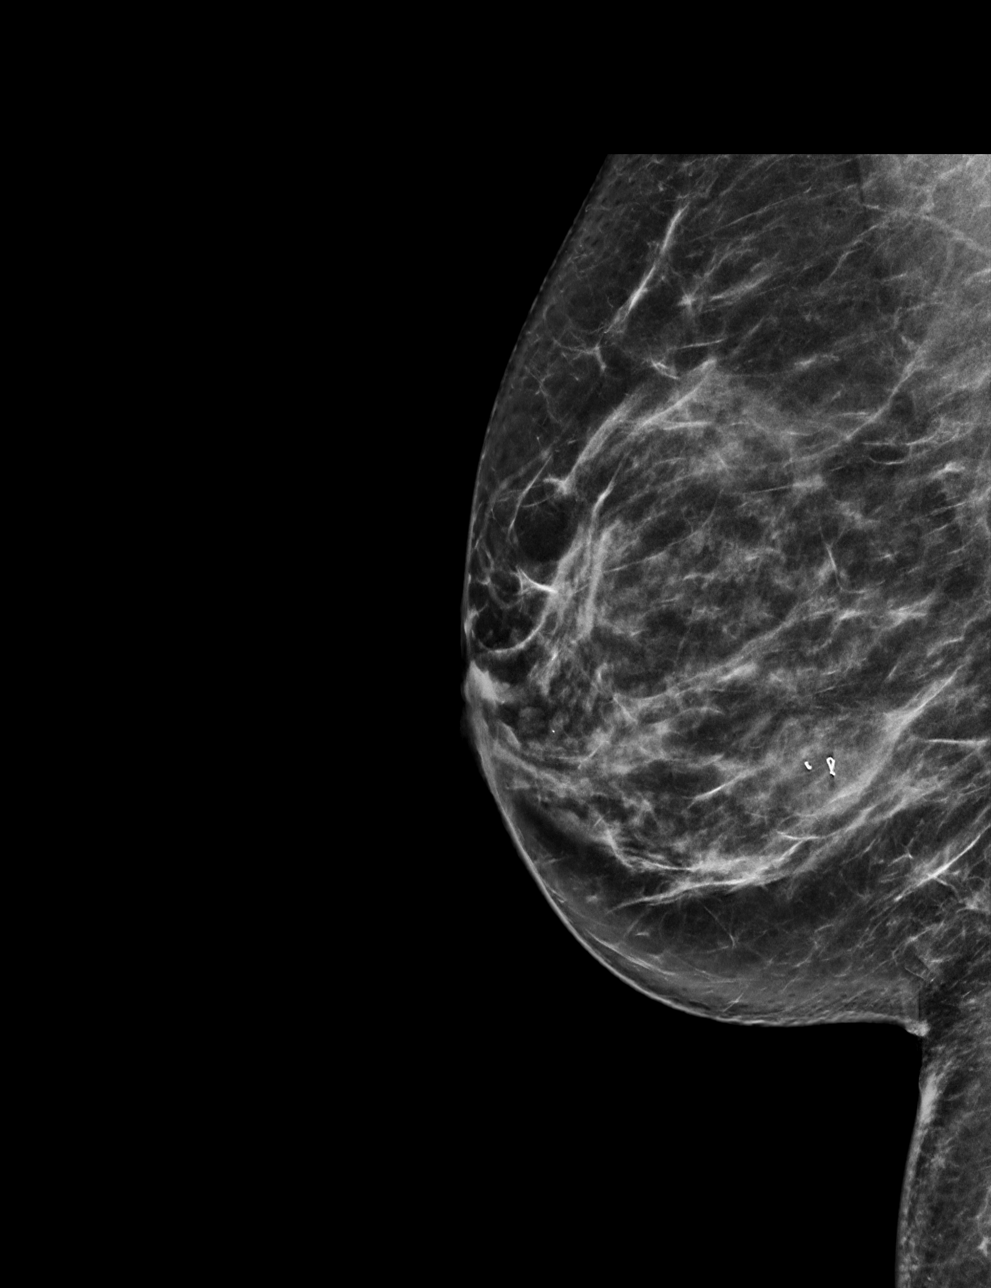

[R CC synth-2D]
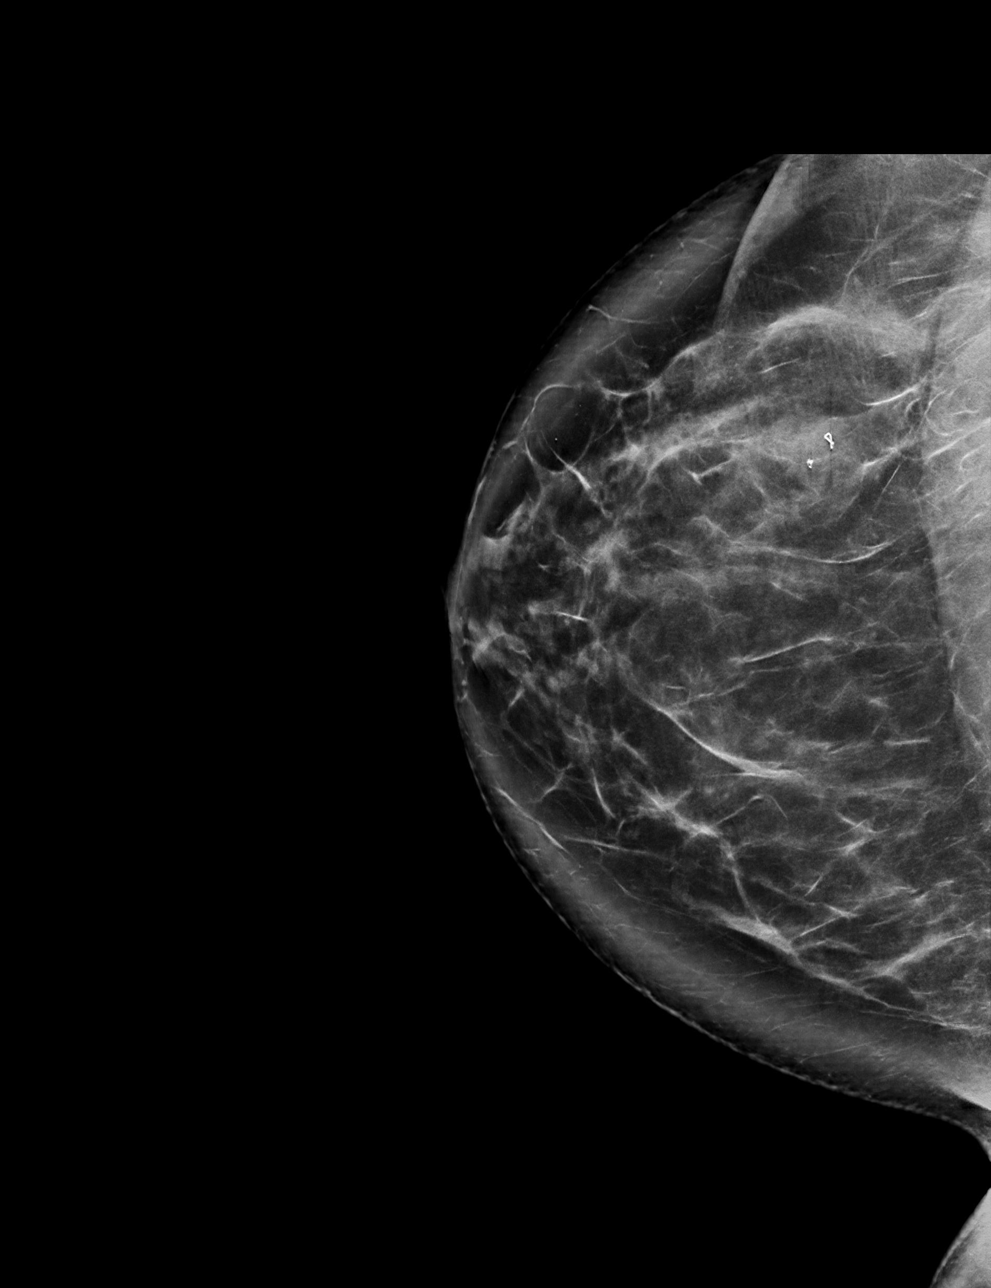

[R ML tomo · tomo slice 38/75.0]
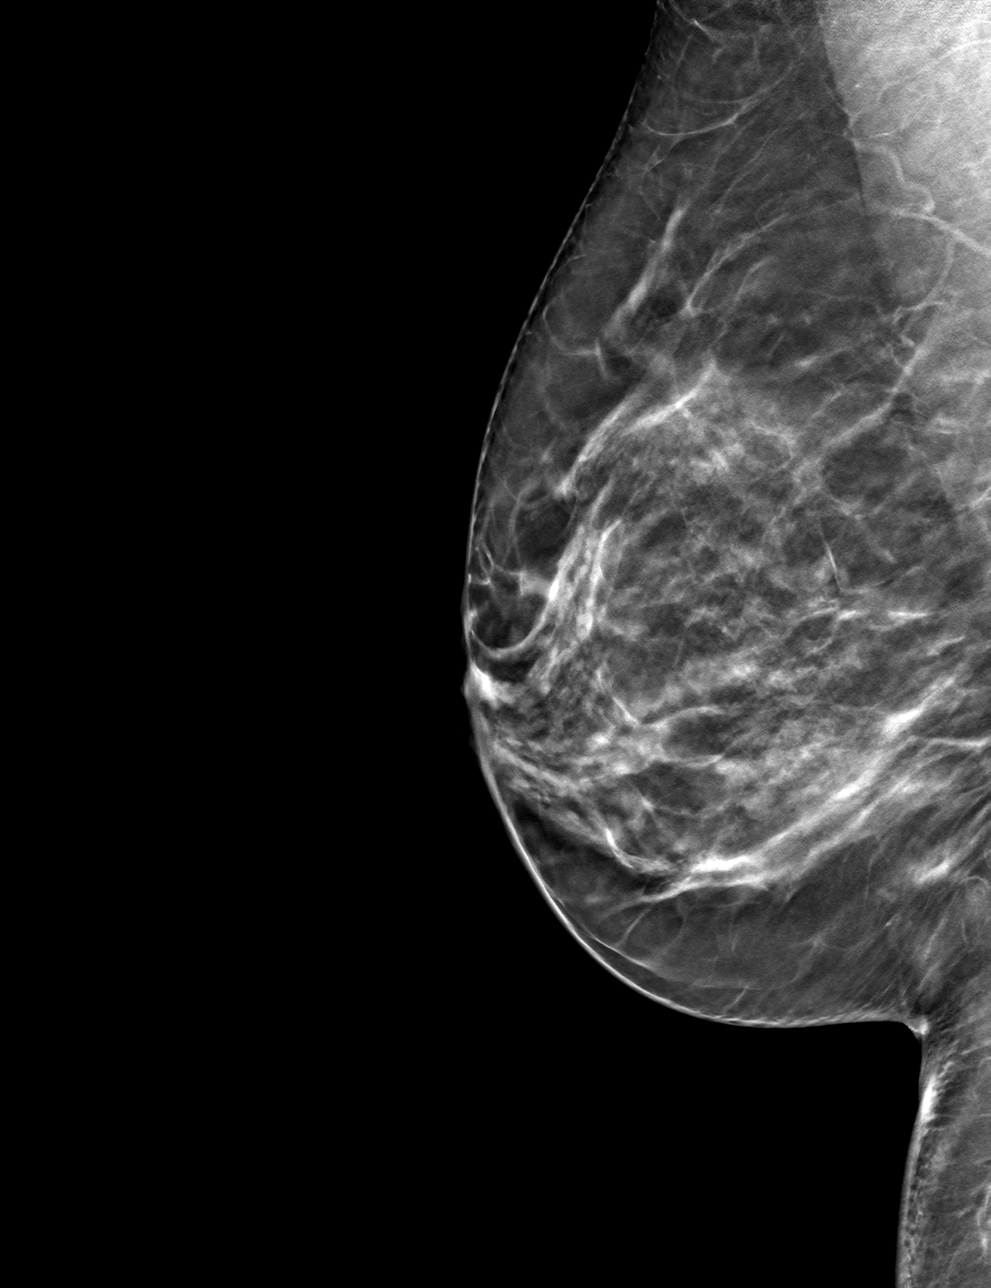

[R CC tomo · tomo slice 48/95.0]
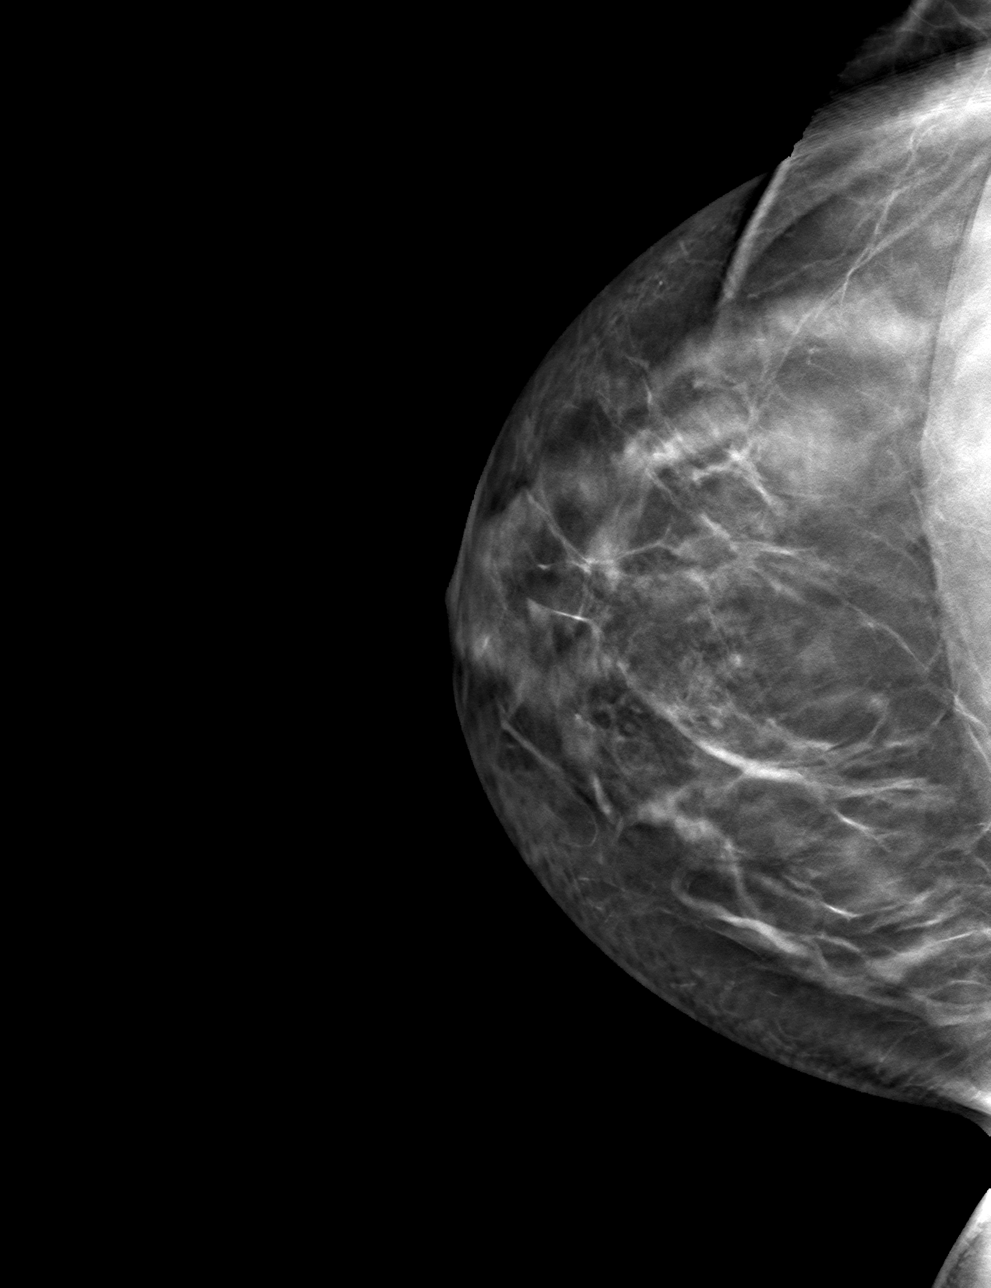

[4 of 12 positions shown; findings below may reference images not displayed]

FINDINGS: Mammographic images were obtained following ultrasound guided biopsy
of the RIGHT breast mass at the 8 o'clock axis. The biopsy marking
clip is in expected position at the site of biopsy.
IMPRESSION: Appropriate positioning of the ribbon shaped biopsy marking clip at
the site of biopsy in the lower outer quadrant of the RIGHT breast.

Final Assessment: Post Procedure Mammograms for Marker Placement

## 2020-12-05 ENCOUNTER — Other Ambulatory Visit: Payer: Self-pay | Admitting: Internal Medicine

## 2020-12-05 DIAGNOSIS — O905 Postpartum thyroiditis: Secondary | ICD-10-CM

## 2020-12-05 DIAGNOSIS — E042 Nontoxic multinodular goiter: Secondary | ICD-10-CM

## 2020-12-05 DIAGNOSIS — E0590 Thyrotoxicosis, unspecified without thyrotoxic crisis or storm: Secondary | ICD-10-CM

## 2020-12-12 ENCOUNTER — Ambulatory Visit
Admission: RE | Admit: 2020-12-12 | Discharge: 2020-12-12 | Disposition: A | Discharge status: Home / Self Care | Payer: BC Managed Care – PPO | Source: Ambulatory Visit | Attending: Internal Medicine | Admitting: Internal Medicine

## 2020-12-12 DIAGNOSIS — E0590 Thyrotoxicosis, unspecified without thyrotoxic crisis or storm: Secondary | ICD-10-CM

## 2020-12-12 DIAGNOSIS — E042 Nontoxic multinodular goiter: Secondary | ICD-10-CM

## 2020-12-12 DIAGNOSIS — O905 Postpartum thyroiditis: Secondary | ICD-10-CM

## 2021-10-06 IMAGING — US US THYROID
1 series · 14 of 25 positions shown · non-contrast
Comparison: None.

CLINICAL DATA: Other. 39-year-old female with postpartum
thyroiditis.

EXAM:
THYROID ULTRASOUND
TECHNIQUE: Ultrasound examination of the thyroid gland and adjacent soft
tissues was performed.

[Series 1: us thyroid · 0.09mm/px · 14 of 30 slices shown]
[im 1/30]
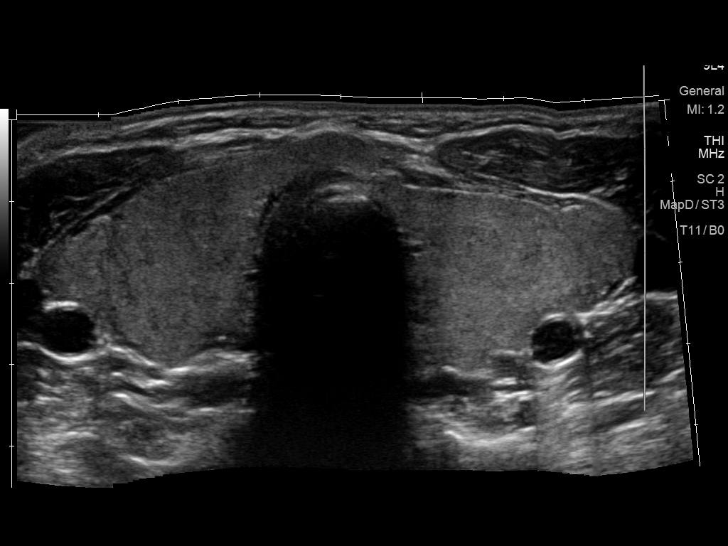
[im 3/30]
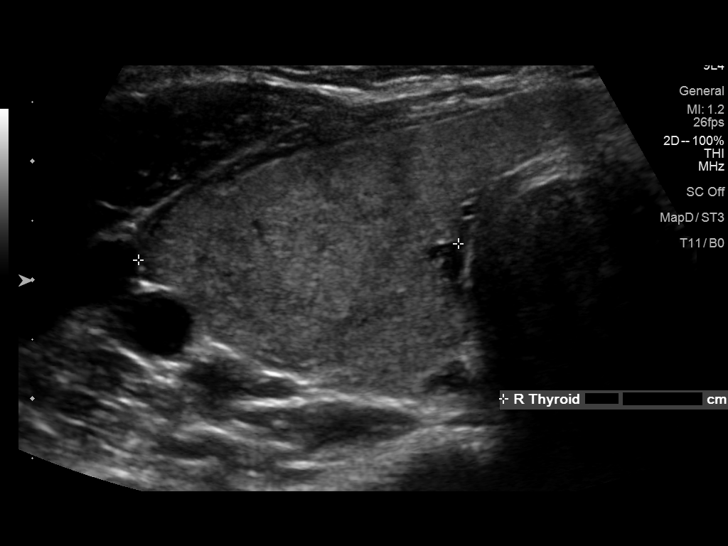
[im 5/30]
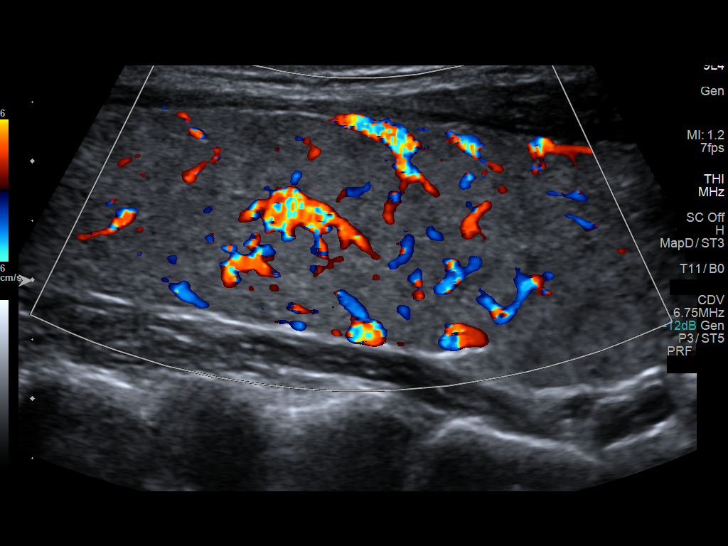
[im 8/30]
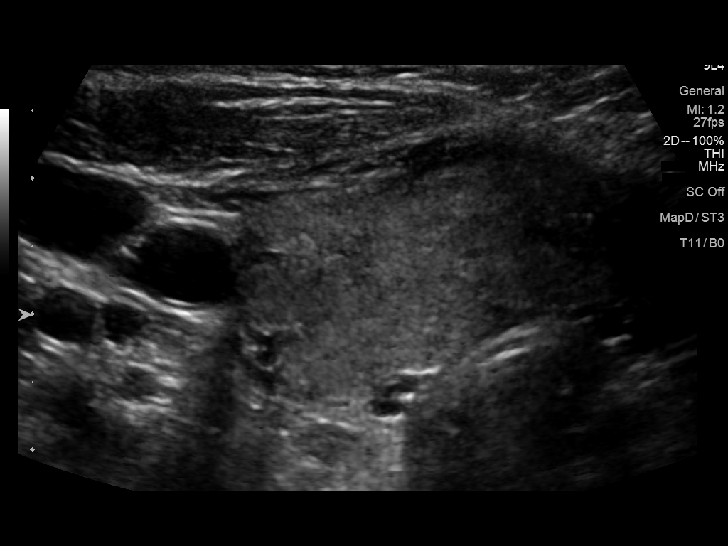
[im 10/30]
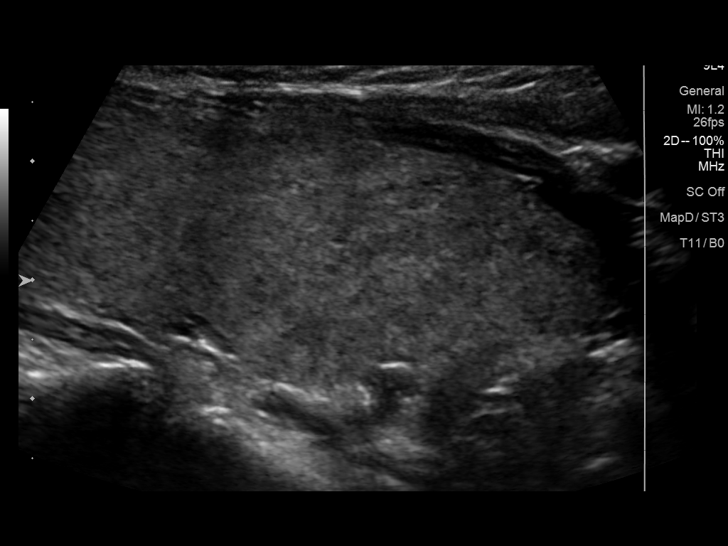
[im 11/30]
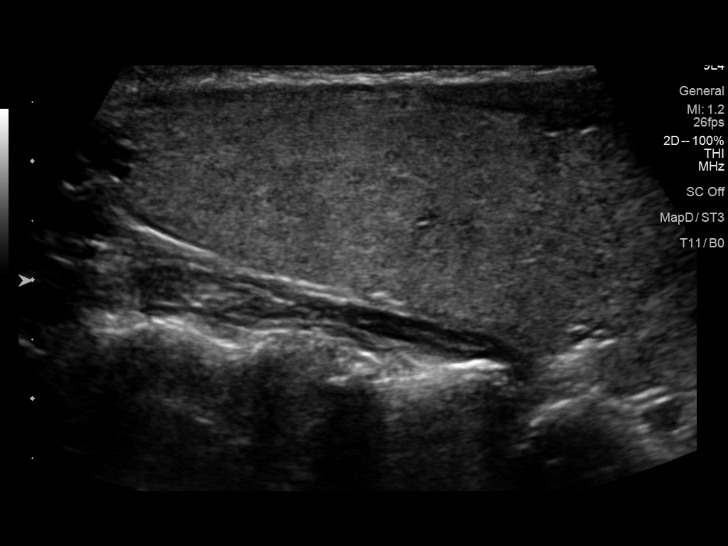
[im 14/30]
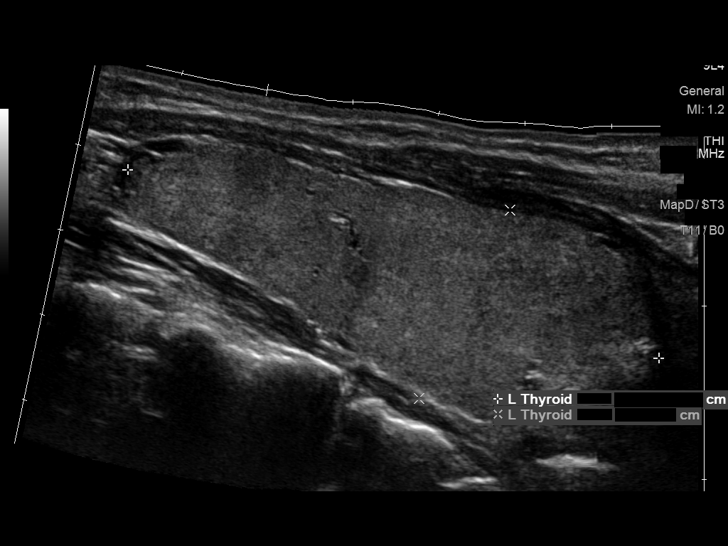
[im 16/30]
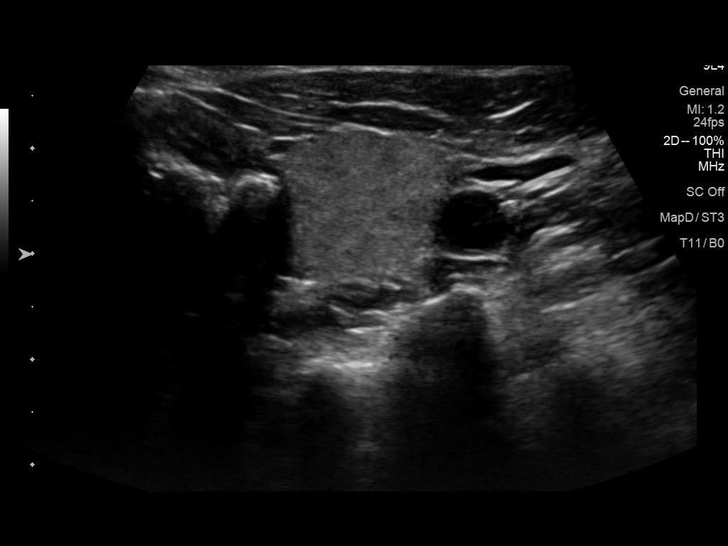
[im 19/30]
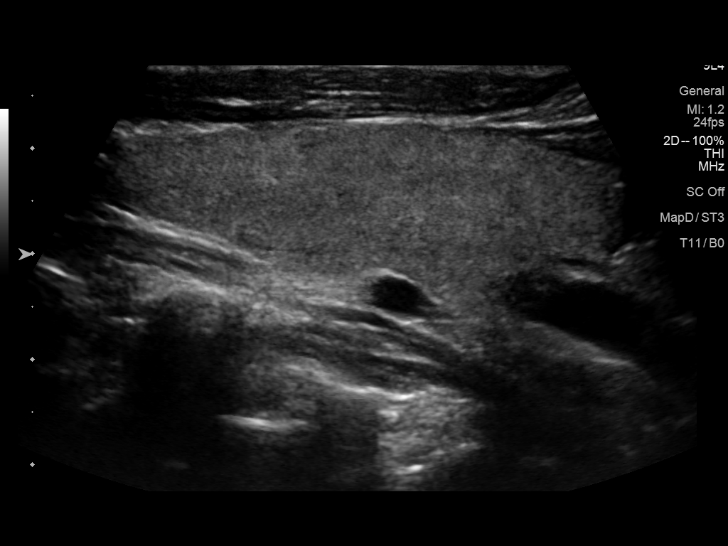
[im 20/30]
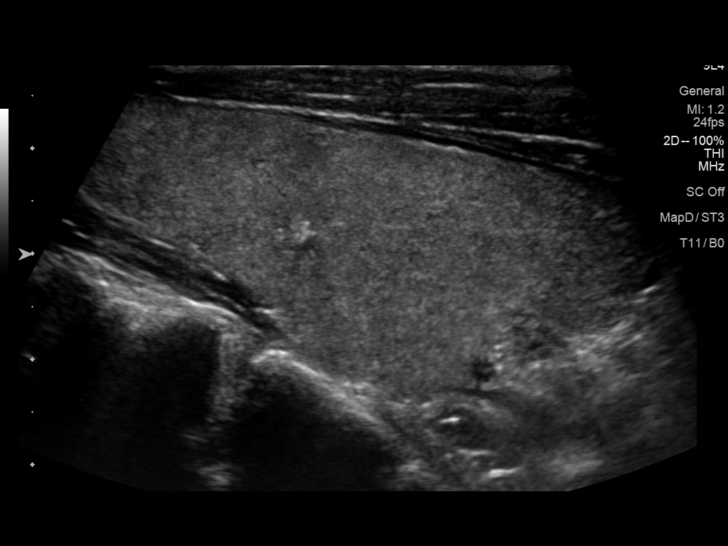
[im 22/30]
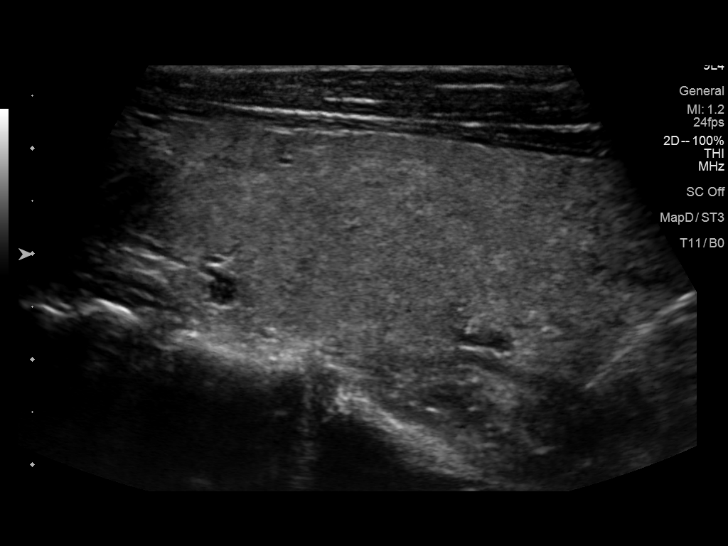
[im 25/30]
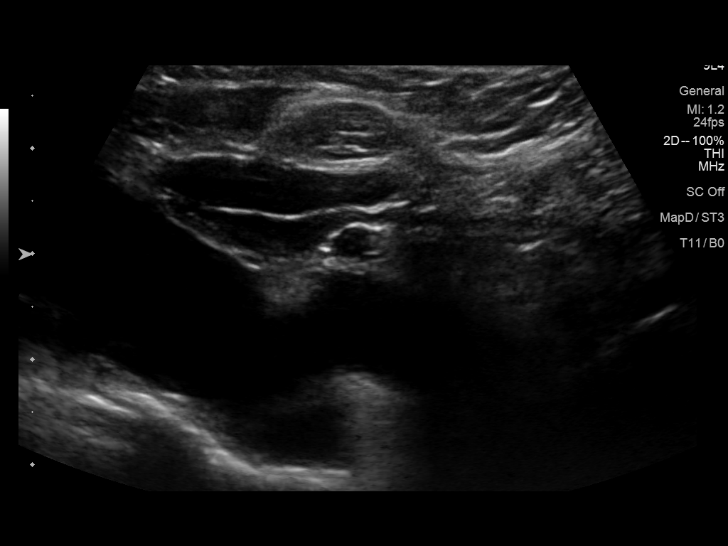
[im 27/30]
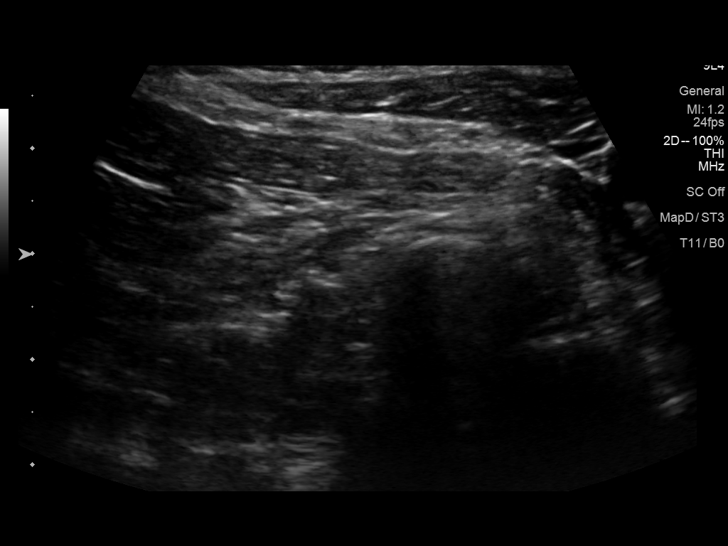
[im 30/30]
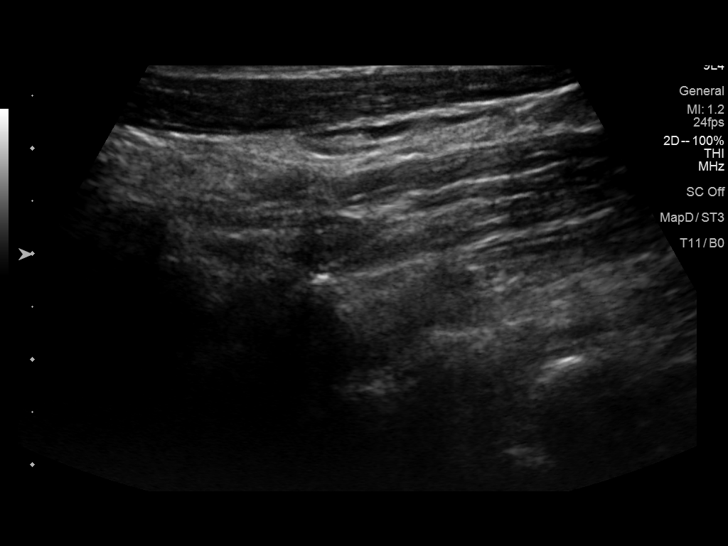

[14 of 25 positions shown; findings below may reference images not displayed]

FINDINGS: Parenchymal Echotexture: Mildly heterogenous, mildly hypervascular

Isthmus: 0.6 cm

Right lobe: 7.7 x 2.4 x 2.7 cm

Left lobe: 6.5 x 2.4 x 3.0 cm

_________________________________________________________

Estimated total number of nodules >/= 1 cm: 0

Number of spongiform nodules >/=  2 cm not described below (TR1): 0

Number of mixed cystic and solid nodules >/= 1.5 cm not described
below (TR2): 0

_________________________________________________________

No discrete nodules are seen within the thyroid gland. No cervical
lymphadenopathy.
IMPRESSION: Mildly enlarged, mildly heterogeneous, and mildly hypervascular
thyroid gland without discrete nodule. These findings are
nonspecific, however compatible with reported history of
thyroiditis.

## 2022-03-04 ENCOUNTER — Other Ambulatory Visit (HOSPITAL_COMMUNITY): Payer: Self-pay

## 2022-03-04 MED ORDER — WEGOVY 1 MG/0.5ML ~~LOC~~ SOAJ
1.0000 mg | SUBCUTANEOUS | 3 refills | Status: DC
  Filled 2022-03-04: qty 2, 28d supply, fill #0
  Filled 2022-04-05: qty 2, 28d supply, fill #1
  Filled 2022-05-13 – 2022-05-18 (×2): qty 2, 28d supply, fill #2
  Filled 2022-06-13: qty 2, 28d supply, fill #3

## 2022-04-08 ENCOUNTER — Other Ambulatory Visit (HOSPITAL_COMMUNITY): Payer: Self-pay

## 2022-04-18 ENCOUNTER — Other Ambulatory Visit (HOSPITAL_COMMUNITY): Payer: Self-pay

## 2022-04-24 ENCOUNTER — Other Ambulatory Visit (HOSPITAL_COMMUNITY): Payer: Self-pay

## 2022-04-24 MED ORDER — WEGOVY 1.7 MG/0.75ML ~~LOC~~ SOAJ
1.7000 mg | SUBCUTANEOUS | 3 refills | Status: DC
  Filled 2022-04-24: qty 3, 28d supply, fill #0
  Filled 2022-05-13 – 2022-05-31 (×3): qty 3, 28d supply, fill #1
  Filled 2022-06-27: qty 3, 28d supply, fill #2
  Filled 2022-07-31: qty 3, 28d supply, fill #3

## 2022-04-25 ENCOUNTER — Other Ambulatory Visit (HOSPITAL_COMMUNITY): Payer: Self-pay

## 2022-05-14 ENCOUNTER — Other Ambulatory Visit (HOSPITAL_COMMUNITY): Payer: Self-pay

## 2022-05-20 ENCOUNTER — Other Ambulatory Visit (HOSPITAL_COMMUNITY): Payer: Self-pay

## 2022-05-31 ENCOUNTER — Other Ambulatory Visit (HOSPITAL_COMMUNITY): Payer: Self-pay

## 2022-06-03 ENCOUNTER — Other Ambulatory Visit (HOSPITAL_COMMUNITY): Payer: Self-pay

## 2022-07-06 ENCOUNTER — Other Ambulatory Visit (HOSPITAL_COMMUNITY): Payer: Self-pay

## 2022-07-31 ENCOUNTER — Other Ambulatory Visit (HOSPITAL_COMMUNITY): Payer: Self-pay

## 2022-07-31 ENCOUNTER — Other Ambulatory Visit: Payer: Self-pay

## 2022-08-01 ENCOUNTER — Other Ambulatory Visit (HOSPITAL_COMMUNITY): Payer: Self-pay

## 2022-08-01 MED ORDER — WEGOVY 1.7 MG/0.75ML ~~LOC~~ SOAJ
SUBCUTANEOUS | 0 refills | Status: DC
  Filled 2022-08-01: qty 3, 28d supply, fill #0

## 2022-08-06 ENCOUNTER — Other Ambulatory Visit (HOSPITAL_COMMUNITY): Payer: Self-pay

## 2022-08-06 MED ORDER — WEGOVY 1 MG/0.5ML ~~LOC~~ SOAJ
1.0000 mg | SUBCUTANEOUS | 3 refills | Status: AC
  Filled 2022-08-06 – 2022-09-19 (×6): qty 2, 28d supply, fill #0

## 2022-08-09 ENCOUNTER — Other Ambulatory Visit: Payer: Self-pay

## 2022-08-09 ENCOUNTER — Other Ambulatory Visit (HOSPITAL_COMMUNITY): Payer: Self-pay

## 2022-08-12 ENCOUNTER — Other Ambulatory Visit (HOSPITAL_COMMUNITY): Payer: Self-pay

## 2022-08-22 ENCOUNTER — Other Ambulatory Visit: Payer: Self-pay

## 2022-08-26 ENCOUNTER — Other Ambulatory Visit (HOSPITAL_COMMUNITY): Payer: Self-pay

## 2022-08-29 ENCOUNTER — Other Ambulatory Visit (HOSPITAL_COMMUNITY): Payer: Self-pay

## 2022-09-12 ENCOUNTER — Other Ambulatory Visit (HOSPITAL_COMMUNITY): Payer: Self-pay

## 2022-09-15 ENCOUNTER — Other Ambulatory Visit (HOSPITAL_COMMUNITY): Payer: Self-pay

## 2022-09-16 ENCOUNTER — Other Ambulatory Visit (HOSPITAL_COMMUNITY): Payer: Self-pay

## 2022-09-16 MED ORDER — WEGOVY 1 MG/0.5ML ~~LOC~~ SOAJ
SUBCUTANEOUS | 3 refills | Status: AC
  Filled 2022-09-16: qty 2, 30d supply, fill #0

## 2022-09-16 MED ORDER — WEGOVY 1 MG/0.5ML ~~LOC~~ SOAJ
1.0000 mg | SUBCUTANEOUS | 3 refills | Status: AC
  Filled 2022-09-16 – 2022-09-24 (×2): qty 2, 28d supply, fill #0

## 2022-09-17 ENCOUNTER — Other Ambulatory Visit (HOSPITAL_COMMUNITY): Payer: Self-pay

## 2022-09-19 ENCOUNTER — Other Ambulatory Visit (HOSPITAL_COMMUNITY): Payer: Self-pay

## 2022-09-20 ENCOUNTER — Other Ambulatory Visit (HOSPITAL_COMMUNITY): Payer: Self-pay

## 2022-09-24 ENCOUNTER — Other Ambulatory Visit (HOSPITAL_COMMUNITY): Payer: Self-pay

## 2022-11-24 ENCOUNTER — Ambulatory Visit
Admission: EM | Admit: 2022-11-24 | Discharge: 2022-11-24 | Disposition: A | Discharge status: Home / Self Care | Payer: BC Managed Care – PPO

## 2022-11-24 DIAGNOSIS — R21 Rash and other nonspecific skin eruption: Secondary | ICD-10-CM | POA: Diagnosis not present

## 2022-11-24 MED ORDER — CLOTRIMAZOLE-BETAMETHASONE 1-0.05 % EX CREA: TOPICAL_CREAM | CUTANEOUS | 0 refills | Status: AC

## 2022-11-24 NOTE — Discharge Instructions (Signed)
Start clotrimazole/betamethasone topical cream to areas twice daily Follow-up with your PCP if symptoms do not improve Please go to the ER if symptoms worsen

## 2022-11-24 NOTE — ED Triage Notes (Signed)
Pt states that she has a rash under both arms. X2 days

## 2022-11-24 NOTE — ED Provider Notes (Signed)
[facility]    CSN: [id number] Arrival date & time: 11/24/22  1320      History   Chief Complaint Chief Complaint  Patient presents with   Allergic Reaction    Entered by patient    HPI [name] is a 42 y.o. female presents for evaluation of rash.  Patient reports 2 days of a pruritic rash on her right upper arm, left upper arm, underneath her right breast.  States it is very itchy but denies any swelling, drainage, warmth, scaling.  The status started after she used a new deodorant and since returning to her previous deodorant she has had no more additional areas pop up.  No history of eczema or psoriasis.  No fevers or chills.  She has been using an antifungal cream with some improvement. No other concerns at this time.   Allergic Reaction Presenting symptoms: rash     Past Medical History:  Diagnosis Date   IBS (irritable bowel syndrome)    Leg fracture, right     Patient Active Problem List   Diagnosis Date Noted   Indication for care in labor or delivery 10/05/2017   VBAC (vaginal birth after Cesarean) 05/23/2015   Pregnancy 05/22/2015    Past Surgical History:  Procedure Laterality Date   CESAREAN SECTION N/A 09/11/2012   Procedure: CESAREAN SECTION;  Surgeon: Leslie Andrea, MD;  Location: [facility];  Service: Obstetrics;  Laterality: N/A;   GYNECOLOGIC CRYOSURGERY  2008   NO PAST SURGERIES     ORIF FIBULA FRACTURE Right 11/18/2012   Procedure: OPEN REDUCTION INTERNAL FIXATION (ORIF) FIBULA FRACTURE;  Surgeon: Nadara Mustard, MD;  Location: [facility];  Service: Orthopedics;  Laterality: Right;  Open Reduction Internal Fixation Right Fibula and Open Reduction Internal Fixation Right Lisfranc joint    OB History     Gravida  6   Para  4   Term  4   Preterm      AB  2   Living  4      SAB      IAB      Ectopic      Multiple  0   Live Births  4            Home Medications    Prior to Admission medications   Medication Sig  Start Date End Date Taking? Authorizing Provider  Cetirizine HCl (ZYRTEC ALLERGY PO) Take by mouth.   Yes [provider]  clotrimazole-betamethasone (LOTRISONE) cream Apply to affected area 2 times daily prn 11/24/22  Yes Radford Pax, NP  methimazole (TAPAZOLE) 10 MG tablet TAKE 2 TABLETS ONCE A DAY FOR 5 DAYS.   Yes [provider]  acetaminophen (TYLENOL) 650 MG CR tablet Take 1,300 mg by mouth every 8 (eight) hours as needed for pain. MUSCLE ACHES AND PAIN.    [provider]  Multiple Vitamin (MULTIVITAMIN WITH MINERALS) TABS tablet Take 1 tablet by mouth every evening.    [provider]  Probiotic Product (PROBIOTIC PO) Take 1 capsule by mouth every evening.    [provider]  Semaglutide-Weight Management (WEGOVY) 1 MG/0.5ML SOAJ Inject 1 mg into the skin once a week. 08/06/22     Semaglutide-Weight Management (WEGOVY) 1 MG/0.5ML SOAJ Inject 1 mg into the skin once a week. 09/16/22     Semaglutide-Weight Management (WEGOVY) 1 MG/0.5ML SOAJ Inject 1 mg under the skin once weekly 09/15/22     Semaglutide-Weight Management (WEGOVY) 1.7 MG/0.75ML  SOAJ Inject 1.7 mg into the skin once a week. 04/24/22       Family History Family History  Problem Relation Age of Onset   Breast cancer Mother 41    Social History Social History   Tobacco Use   Smoking status: Never   Smokeless tobacco: Never  Substance Use Topics   Alcohol use: No   Drug use: No     Allergies   Patient has no known allergies.   Review of Systems Review of Systems  Skin:  Positive for rash.     Physical Exam Triage Vital Signs ED Triage Vitals  Enc Vitals Group     BP 11/24/22 1356 109/70     Pulse Rate 11/24/22 1356 75     Resp 11/24/22 1356 17     Temp 11/24/22 1356 98 F (36.7 C)     Temp Source 11/24/22 1356 Oral     SpO2 11/24/22 1356 98 %     Weight 11/24/22 1354 168 lb (76.2 kg)     Height 11/24/22 1354 5\' 4"  (1.626 m)     Head Circumference --       Peak Flow --      Pain Score 11/24/22 1354 0     Pain Loc --      Pain Edu? --      Excl. in GC? --    No data found.  Updated Vital Signs BP 109/70 (BP Location: Right Arm)   Pulse 75   Temp 98 F (36.7 C) (Oral)   Resp 17   Ht 5\' 4"  (1.626 m)   Wt 168 lb (76.2 kg)   LMP 11/16/2022   SpO2 98%   BMI 28.84 kg/m   Visual Acuity Right Eye Distance:   Left Eye Distance:   Bilateral Distance:    Right Eye Near:   Left Eye Near:    Bilateral Near:     Physical Exam Vitals and nursing note reviewed.  Constitutional:      General: She is not in acute distress.    Appearance: Normal appearance. She is not ill-appearing, toxic-appearing or diaphoretic.  HENT:     Head: Normocephalic and atraumatic.  Eyes:     Pupils: Pupils are equal, round, and reactive to light.  Cardiovascular:     Rate and Rhythm: Normal rate.  Pulmonary:     Effort: Pulmonary effort is normal.  Skin:    General: Skin is warm and dry.          Comments: Scattered mildly erythematous macular papular rash noted on right upper arm, right breast, and left posterior arm.  No central clearing or scaling.  No swelling or drainage.  Neurological:     General: No focal deficit present.     Mental Status: She is alert and oriented to person, place, and time.  Psychiatric:        Mood and Affect: Mood normal.        Behavior: Behavior normal.      UC Treatments / Results  Labs (all labs ordered are listed, but only abnormal results are displayed) Labs Reviewed - No data to display  EKG   Radiology No results found.  Procedures Procedures (including critical care time)  Medications Ordered in UC Medications - No data to display  Initial Impression / Assessment and Plan / UC Course  I have reviewed the triage vital signs and the nursing notes.  Pertinent labs & imaging results that were available during my  care of the patient were reviewed by me and considered in my medical decision making  (see chart for details).     Reviewed exam and symptoms with patient.  No red flags.  Trial of clotrimazole-betamethasone cream to affected areas twice daily PCP follow-up if symptoms do not improve ER precautions reviewed and patient verbalized understanding Final Clinical Impressions(s) / UC Diagnoses   Final diagnoses:  Rash and nonspecific skin eruption     Discharge Instructions      Start clotrimazole/betamethasone topical cream to areas twice daily Follow-up with your PCP if symptoms do not improve Please go to the ER if symptoms worsen    ED Prescriptions     Medication Sig Dispense Auth. Provider   clotrimazole-betamethasone (LOTRISONE) cream Apply to affected area 2 times daily prn 45 g Radford Pax, NP      PDMP not reviewed this encounter.   Radford Pax, NP 11/24/22 1423

## 2022-12-06 ENCOUNTER — Ambulatory Visit: Payer: BC Managed Care – PPO | Admitting: Nurse Practitioner

## 2022-12-14 ENCOUNTER — Other Ambulatory Visit (HOSPITAL_COMMUNITY): Payer: Self-pay

## 2022-12-17 ENCOUNTER — Other Ambulatory Visit (HOSPITAL_COMMUNITY): Payer: Self-pay

## 2022-12-17 MED ORDER — WEGOVY 1.7 MG/0.75ML ~~LOC~~ SOAJ
1.7000 mg | SUBCUTANEOUS | 3 refills | Status: AC
  Filled 2022-12-17 – 2023-01-22 (×2): qty 3, 28d supply, fill #0
  Filled 2023-03-01: qty 3, 28d supply, fill #1
  Filled 2023-04-24 – 2023-04-26 (×2): qty 3, 28d supply, fill #2
  Filled 2023-05-19: qty 3, 28d supply, fill #3

## 2023-01-04 ENCOUNTER — Other Ambulatory Visit (HOSPITAL_COMMUNITY): Payer: Self-pay

## 2023-01-22 ENCOUNTER — Other Ambulatory Visit (HOSPITAL_COMMUNITY): Payer: Self-pay

## 2023-01-23 ENCOUNTER — Other Ambulatory Visit: Payer: Self-pay

## 2023-03-07 ENCOUNTER — Other Ambulatory Visit (HOSPITAL_COMMUNITY): Payer: Self-pay

## 2023-03-17 ENCOUNTER — Other Ambulatory Visit (HOSPITAL_COMMUNITY): Payer: Self-pay

## 2023-03-22 ENCOUNTER — Other Ambulatory Visit (HOSPITAL_COMMUNITY): Payer: Self-pay

## 2023-04-24 ENCOUNTER — Other Ambulatory Visit: Payer: Self-pay

## 2023-04-25 ENCOUNTER — Other Ambulatory Visit: Payer: Self-pay

## 2023-04-26 ENCOUNTER — Other Ambulatory Visit (HOSPITAL_COMMUNITY): Payer: Self-pay

## 2023-06-20 ENCOUNTER — Other Ambulatory Visit (HOSPITAL_COMMUNITY): Payer: Self-pay

## 2023-07-28 ENCOUNTER — Ambulatory Visit: Payer: Self-pay | Admitting: Internal Medicine

## 2023-09-01 ENCOUNTER — Ambulatory Visit
Admission: RE | Admit: 2023-09-01 | Discharge: 2023-09-01 | Disposition: A | Discharge status: Home / Self Care | Source: Ambulatory Visit | Attending: Family Medicine | Admitting: Family Medicine

## 2023-09-01 VITALS — BP 122/77 | HR 75 | Temp 99.1°F | Resp 16

## 2023-09-01 DIAGNOSIS — J101 Influenza due to other identified influenza virus with other respiratory manifestations: Secondary | ICD-10-CM

## 2023-09-01 LAB — POC COVID19/FLU A&B COMBO
Covid Antigen, POC: NEGATIVE
Influenza A Antigen, POC: POSITIVE — AB
Influenza B Antigen, POC: NEGATIVE

## 2023-09-01 MED ORDER — PROMETHAZINE-DM 6.25-15 MG/5ML PO SYRP: 5.0000 mL | ORAL_SOLUTION | Freq: Four times a day (QID) | ORAL | 0 refills | Status: AC | PRN

## 2023-09-01 NOTE — ED Triage Notes (Signed)
 Pt c/o cough, fatigue, sore throat, and headaches since Friday.

## 2023-09-01 NOTE — ED Provider Notes (Signed)
 [facility]    CSN: [id number] Arrival date & time: 09/01/23  1729      History   Chief Complaint Chief Complaint  Patient presents with   Cough    Sinus infection - Entered by patient    HPI [name] is a 43 y.o. female.   Patient presents today for evaluation of cough, congestion, headache, fatigue and sore throat with an onset that of 3 days ago.  Patient today on arrival had a low-grade temp of 99.1.  Patient reports she is a [employer] loss [employer] and has likely been exposed to several illnesses.  She has been taken over-the-counter medication with minimal relief.  Denies any difficulty breathing, history of asthma or chest tightness.  Past Medical History:  Diagnosis Date   IBS (irritable bowel syndrome)    Leg fracture, right     Patient Active Problem List   Diagnosis Date Noted   Indication for care in labor or delivery 10/05/2017   VBAC (vaginal birth after Cesarean) 05/23/2015   Pregnancy 05/22/2015    Past Surgical History:  Procedure Laterality Date   CESAREAN SECTION N/A 09/11/2012   Procedure: CESAREAN SECTION;  Surgeon: Leslie Andrea, MD;  Location: [facility];  Service: Obstetrics;  Laterality: N/A;   GYNECOLOGIC CRYOSURGERY  2008   NO PAST SURGERIES     ORIF FIBULA FRACTURE Right 11/18/2012   Procedure: OPEN REDUCTION INTERNAL FIXATION (ORIF) FIBULA FRACTURE;  Surgeon: Nadara Mustard, MD;  Location: [facility];  Service: Orthopedics;  Laterality: Right;  Open Reduction Internal Fixation Right Fibula and Open Reduction Internal Fixation Right Lisfranc joint    OB History     Gravida  6   Para  4   Term  4   Preterm      AB  2   Living  4      SAB      IAB      Ectopic      Multiple  0   Live Births  4            Home Medications    Prior to Admission medications   Medication Sig Start Date End Date Taking? Authorizing Provider  promethazine-dextromethorphan (PROMETHAZINE-DM) 6.25-15 MG/5ML syrup Take 5 mLs by mouth 4  (four) times daily as needed for cough. 09/01/23  Yes Bing Neighbors, NP  acetaminophen (TYLENOL) 650 MG CR tablet Take 1,300 mg by mouth every 8 (eight) hours as needed for pain. MUSCLE ACHES AND PAIN.    [provider]  Cetirizine HCl (ZYRTEC ALLERGY PO) Take by mouth.    [provider]  clotrimazole-betamethasone (LOTRISONE) cream Apply to affected area 2 times daily prn 11/24/22   Radford Pax, NP  methimazole (TAPAZOLE) 10 MG tablet TAKE 2 TABLETS ONCE A DAY FOR 5 DAYS.    [provider]  Multiple Vitamin (MULTIVITAMIN WITH MINERALS) TABS tablet Take 1 tablet by mouth every evening.    [provider]  Probiotic Product (PROBIOTIC PO) Take 1 capsule by mouth every evening.    [provider]  Semaglutide-Weight Management (WEGOVY) 1 MG/0.5ML SOAJ Inject 1 mg into the skin once a week. 08/06/22     Semaglutide-Weight Management (WEGOVY) 1 MG/0.5ML SOAJ Inject 1 mg into the skin once a week. 09/16/22     Semaglutide-Weight Management (WEGOVY) 1 MG/0.5ML SOAJ Inject 1 mg under the skin once weekly 09/15/22     Semaglutide-Weight Management (WEGOVY) 1.7 MG/0.75ML SOAJ Inject 1.7 mg into the  skin once a week. 12/17/22       Family History Family History  Problem Relation Age of Onset   Breast cancer Mother 46    Social History Social History   Tobacco Use   Smoking status: Never   Smokeless tobacco: Never  Substance Use Topics   Alcohol use: No   Drug use: No     Allergies   Patient has no known allergies.   Review of Systems Review of Systems  Respiratory:  Positive for cough.      Physical Exam Triage Vital Signs ED Triage Vitals  Encounter Vitals Group     BP      Systolic BP Percentile      Diastolic BP Percentile      Pulse      Resp      Temp      Temp src      SpO2      Weight      Height      Head Circumference      Peak Flow      Pain Score      Pain Loc      Pain Education      Exclude from Growth  Chart    No data found.  Updated Vital Signs BP 122/77 (BP Location: Right Arm)   Pulse 75   Temp 99.1 F (37.3 C)   Resp 16   LMP 08/03/2023 (Approximate)   SpO2 98%   Visual Acuity Right Eye Distance:   Left Eye Distance:   Bilateral Distance:    Right Eye Near:   Left Eye Near:    Bilateral Near:     Physical Exam Constitutional:      Appearance: She is well-developed. She is ill-appearing.  HENT:     Head: Normocephalic and atraumatic.     Nose: Congestion and rhinorrhea present.     Mouth/Throat:     Mouth: No oral lesions.     Pharynx: Uvula midline. Postnasal drip present. No pharyngeal swelling, oropharyngeal exudate, posterior oropharyngeal erythema or uvula swelling.     Tonsils: No tonsillar exudate or tonsillar abscesses. 0 on the right. 0 on the left.  Eyes:     Conjunctiva/sclera: Conjunctivae normal.     Pupils: Pupils are equal, round, and reactive to light.  Neck:     Thyroid: No thyromegaly.     Trachea: No tracheal deviation.  Cardiovascular:     Rate and Rhythm: Normal rate and regular rhythm.     Heart sounds: Normal heart sounds.  Pulmonary:     Effort: Pulmonary effort is normal.     Breath sounds: Normal breath sounds.  Musculoskeletal:     Cervical back: Normal range of motion and neck supple.  Lymphadenopathy:     Cervical: Cervical adenopathy present.  Skin:    General: Skin is warm and dry.     Capillary Refill: Capillary refill takes less than 2 seconds.  Neurological:     General: No focal deficit present.     Mental Status: She is alert and oriented to person, place, and time.      UC Treatments / Results  Labs (all labs ordered are listed, but only abnormal results are displayed) Labs Reviewed  POC COVID19/FLU A&B COMBO - Abnormal; Notable for the following components:      Result Value   Influenza A Antigen, POC Positive (*)    All other components within normal limits    EKG  Radiology No results  found.  Procedures Procedures (including critical care time)  Medications Ordered in UC Medications - No data to display  Initial Impression / Assessment and Plan / UC Course  I have reviewed the triage vital signs and the nursing notes.  Pertinent labs & imaging results that were available during my care of the patient were reviewed by me and considered in my medical decision making (see chart for details).   Influenza A confirmed by point-of-care testing.  Patient declines Tamiflu.  Symptom management indicated only, per discharge instructions.  ER precautions given if symptoms become severe.  Work note provided. Final Clinical Impressions(s) / UC Diagnoses   Final diagnoses:  Influenza A     Discharge Instructions      Influenza test is positive.  You are considered contagious to others as long as you have a measurable fever with a temperature 100 F.  You should consider yourself infectious until you are fever free for 24 hours without fever lowering medications. Continue to alternate Tylenol and ibuprofen for management of fever.   Force fluids to maintain hydration.  Promethazine DM up to 4 times daily as needed for cough and congestion.  As discussed I recommend starting Flonase 2 sprays in each nares twice daily 5 days for nasal symptoms and  congestion.  If you develop any shortness of breath, wheezing or difficulty breathing go immediately to the nearest emergency department.       ED Prescriptions     Medication Sig Dispense Auth. Provider   promethazine-dextromethorphan (PROMETHAZINE-DM) 6.25-15 MG/5ML syrup Take 5 mLs by mouth 4 (four) times daily as needed for cough. 180 mL Bing Neighbors, NP      PDMP not reviewed this encounter.   Bing Neighbors, NP 09/01/23 [phone]

## 2023-09-01 NOTE — Discharge Instructions (Addendum)
 Influenza test is positive.  You are considered contagious to others as long as you have a measurable fever with a temperature 100 F.  You should consider yourself infectious until you are fever free for 24 hours without fever lowering medications. Continue to alternate Tylenol and ibuprofen for management of fever.   Force fluids to maintain hydration.  Promethazine DM up to 4 times daily as needed for cough and congestion.  As discussed I recommend starting Flonase 2 sprays in each nares twice daily 5 days for nasal symptoms and  congestion.  If you develop any shortness of breath, wheezing or difficulty breathing go immediately to the nearest emergency department.
